# Patient Record
Sex: Male | Born: 1970 | Race: White | Hispanic: Yes | Marital: Married | Smoking: Never smoker
Health system: Southern US, Community
[De-identification: ages and names within clinical notes are randomized; demographics above are authoritative.]

## PROBLEM LIST (undated history)

## (undated) DIAGNOSIS — I10 Essential (primary) hypertension: Secondary | ICD-10-CM

## (undated) DIAGNOSIS — E785 Hyperlipidemia, unspecified: Secondary | ICD-10-CM

## (undated) DIAGNOSIS — K760 Fatty (change of) liver, not elsewhere classified: Secondary | ICD-10-CM

## (undated) HISTORY — PX: COLONOSCOPY: SHX174

## (undated) HISTORY — DX: Fatty (change of) liver, not elsewhere classified: K76.0

---

## 2010-07-02 ENCOUNTER — Other Ambulatory Visit: Payer: Self-pay | Admitting: *Deleted

## 2010-07-02 ENCOUNTER — Ambulatory Visit
Admission: RE | Admit: 2010-07-02 | Discharge: 2010-07-02 | Disposition: A | Discharge status: Home / Self Care | Payer: 59 | Source: Ambulatory Visit | Attending: *Deleted | Admitting: *Deleted

## 2010-07-02 DIAGNOSIS — T1490XA Injury, unspecified, initial encounter: Secondary | ICD-10-CM

## 2010-07-02 DIAGNOSIS — R609 Edema, unspecified: Secondary | ICD-10-CM

## 2011-07-01 ENCOUNTER — Emergency Department (HOSPITAL_COMMUNITY): Admission: EM | Admit: 2011-07-01 | Discharge: 2011-07-01 | Payer: 59 | Source: Home / Self Care

## 2012-03-20 IMAGING — CR DG TOE GREAT 2+V*L*
2 series · 2 of 2 positions shown · non-contrast
Comparison: None

CLINICAL DATA: Injured toe playing basketball.

LEFT TOE - 2+ VIEW

[view not recorded (1 of 2)]
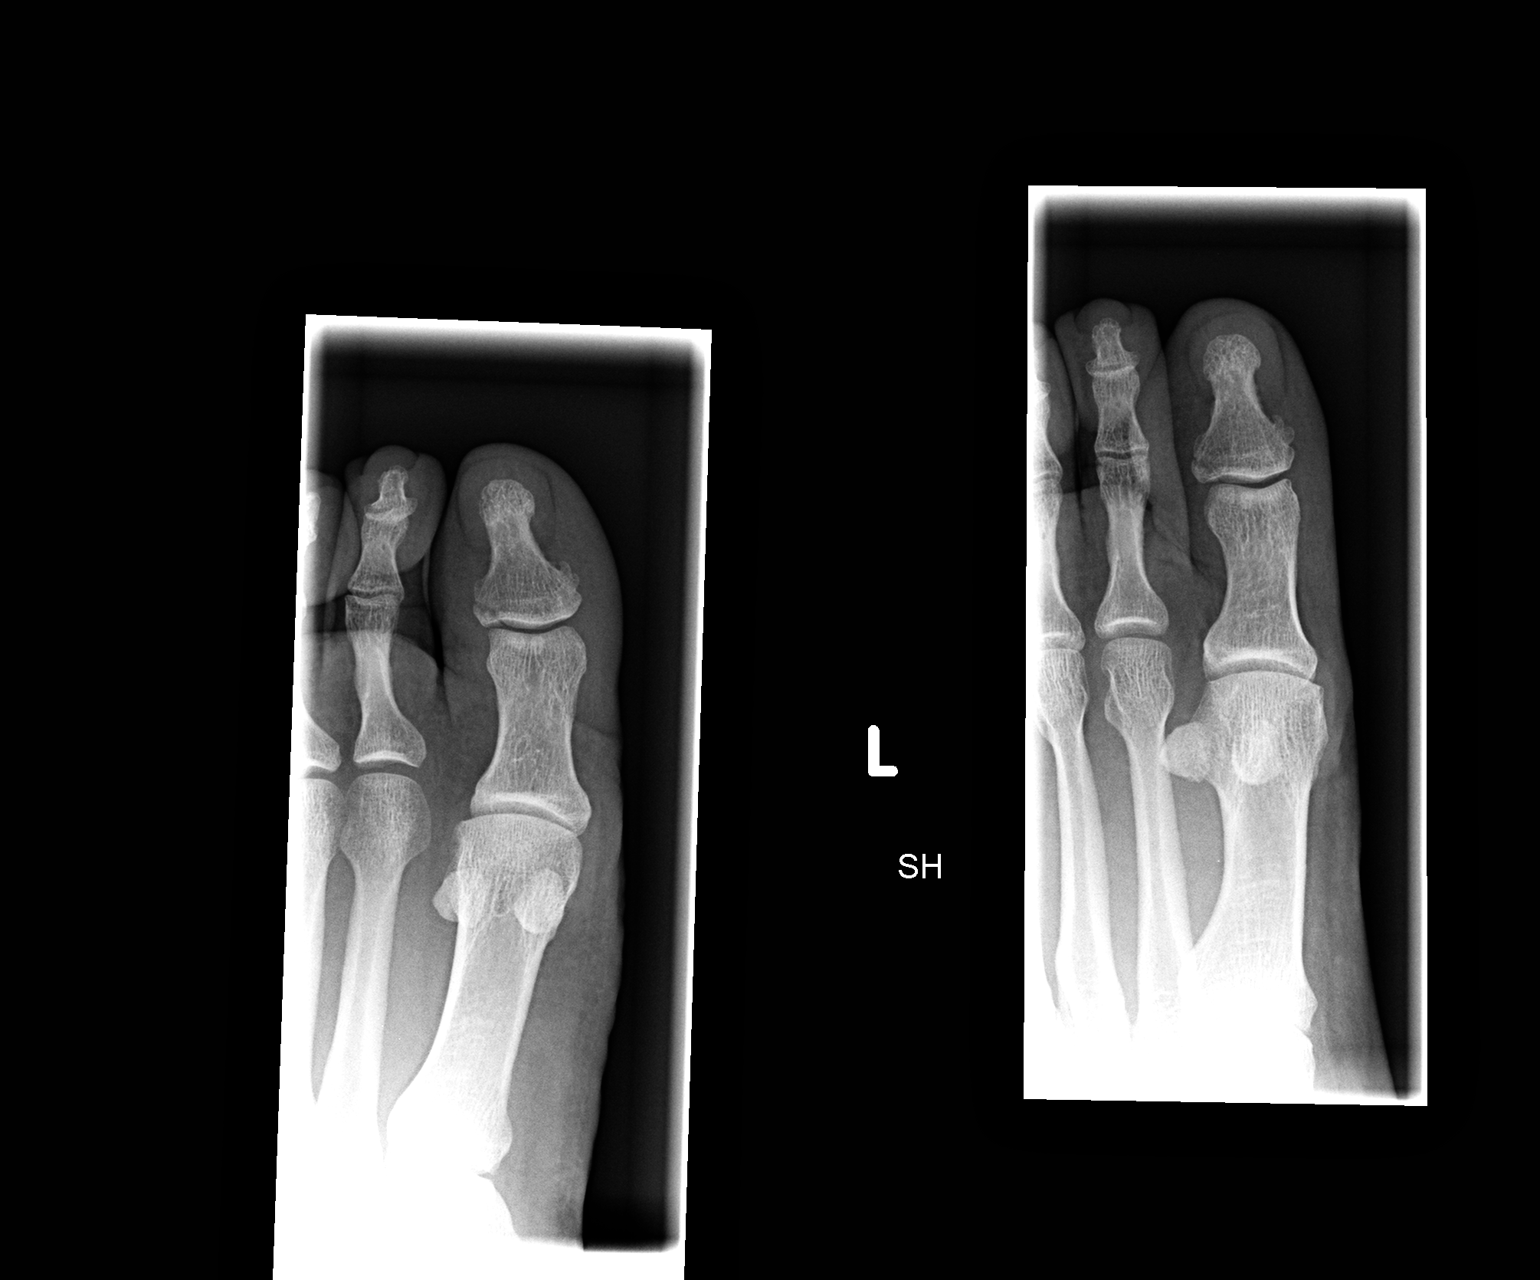

[view not recorded (2 of 2)]
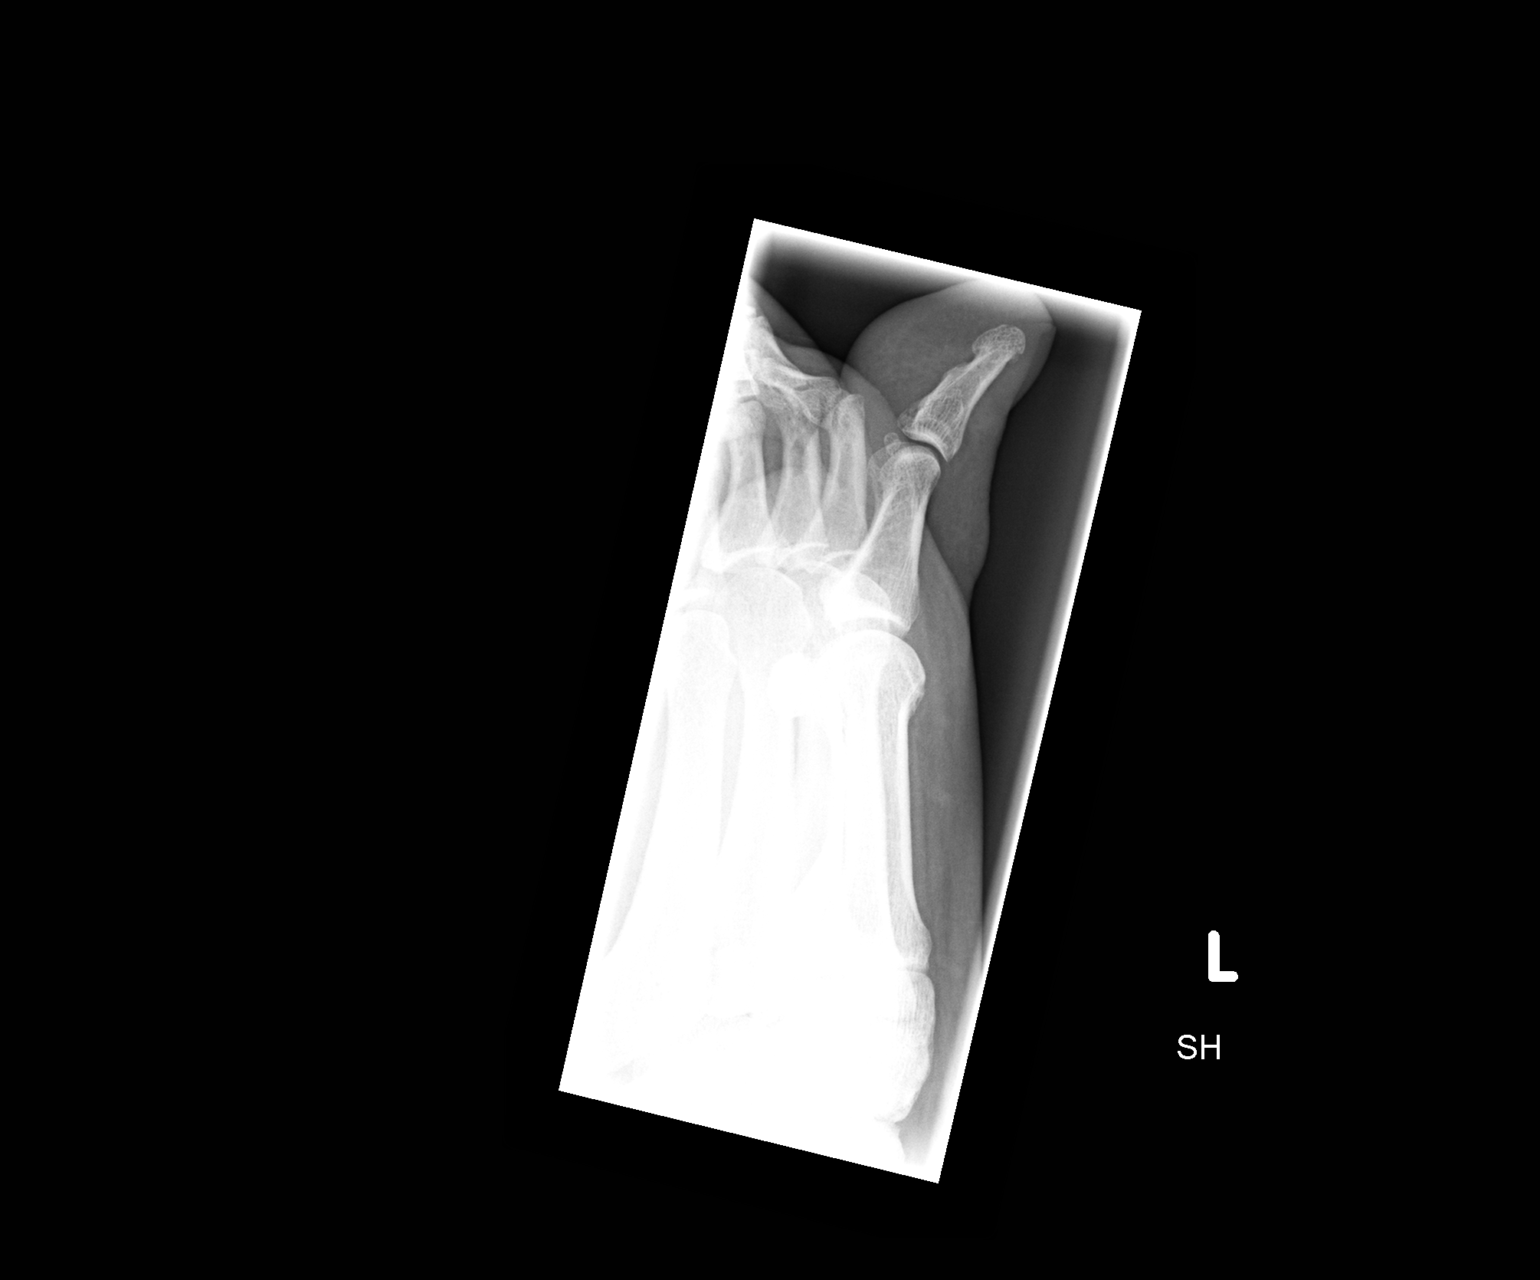

[2 of 2 positions shown; findings below may reference images not displayed]

FINDINGS: There is a intra-articular fracture involving the lateral
aspect of the distal phalanx.  No displacement.  The remaining
joint spaces are maintained.  No other fractures are seen.
IMPRESSION: Nondisplaced intra-articular fracture involving the lateral aspect
of the distal phalanx.

## 2012-07-04 ENCOUNTER — Other Ambulatory Visit: Payer: Self-pay

## 2012-07-04 ENCOUNTER — Ambulatory Visit
Admission: RE | Admit: 2012-07-04 | Discharge: 2012-07-04 | Disposition: A | Discharge status: Home / Self Care | Payer: Managed Care, Other (non HMO) | Source: Ambulatory Visit

## 2012-07-04 DIAGNOSIS — M79609 Pain in unspecified limb: Secondary | ICD-10-CM

## 2015-05-04 HISTORY — PX: ROTATOR CUFF REPAIR: SHX139

## 2015-08-18 DIAGNOSIS — I10 Essential (primary) hypertension: Secondary | ICD-10-CM | POA: Diagnosis not present

## 2015-08-18 DIAGNOSIS — Z0000 Encounter for general adult medical examination without abnormal findings: Secondary | ICD-10-CM | POA: Diagnosis not present

## 2015-08-18 DIAGNOSIS — E7800 Pure hypercholesterolemia, unspecified: Secondary | ICD-10-CM | POA: Diagnosis not present

## 2015-08-18 DIAGNOSIS — R739 Hyperglycemia, unspecified: Secondary | ICD-10-CM | POA: Diagnosis not present

## 2015-11-19 ENCOUNTER — Emergency Department (HOSPITAL_BASED_OUTPATIENT_CLINIC_OR_DEPARTMENT_OTHER)
Admission: EM | Admit: 2015-11-19 | Discharge: 2015-11-19 | Disposition: A | Discharge status: Home / Self Care | Payer: BLUE CROSS/BLUE SHIELD | Attending: Emergency Medicine | Admitting: Emergency Medicine

## 2015-11-19 ENCOUNTER — Encounter (HOSPITAL_BASED_OUTPATIENT_CLINIC_OR_DEPARTMENT_OTHER): Payer: Self-pay | Admitting: Emergency Medicine

## 2015-11-19 DIAGNOSIS — E785 Hyperlipidemia, unspecified: Secondary | ICD-10-CM | POA: Diagnosis not present

## 2015-11-19 DIAGNOSIS — M62830 Muscle spasm of back: Secondary | ICD-10-CM | POA: Diagnosis not present

## 2015-11-19 DIAGNOSIS — M545 Low back pain: Secondary | ICD-10-CM | POA: Diagnosis present

## 2015-11-19 DIAGNOSIS — Z79899 Other long term (current) drug therapy: Secondary | ICD-10-CM | POA: Insufficient documentation

## 2015-11-19 DIAGNOSIS — I10 Essential (primary) hypertension: Secondary | ICD-10-CM | POA: Insufficient documentation

## 2015-11-19 HISTORY — DX: Essential (primary) hypertension: I10

## 2015-11-19 HISTORY — DX: Hyperlipidemia, unspecified: E78.5

## 2015-11-19 MED ORDER — HYDROCODONE-ACETAMINOPHEN 5-325 MG PO TABS: 1.0000 | ORAL_TABLET | Freq: Four times a day (QID) | ORAL | Status: DC | PRN

## 2015-11-19 MED ORDER — IBUPROFEN 800 MG PO TABS: 800.0000 mg | ORAL_TABLET | Freq: Three times a day (TID) | ORAL | Status: DC | PRN

## 2015-11-19 MED ORDER — CYCLOBENZAPRINE HCL 10 MG PO TABS
10.0000 mg | ORAL_TABLET | Freq: Once | ORAL | Status: AC
  Administered 2015-11-19: 10 mg via ORAL
  Filled 2015-11-19: qty 1

## 2015-11-19 MED ORDER — CYCLOBENZAPRINE HCL 10 MG PO TABS: 5.0000 mg | ORAL_TABLET | Freq: Three times a day (TID) | ORAL | Status: DC | PRN

## 2015-11-19 MED ORDER — IBUPROFEN 800 MG PO TABS
800.0000 mg | ORAL_TABLET | Freq: Once | ORAL | Status: AC
  Administered 2015-11-19: 800 mg via ORAL
  Filled 2015-11-19: qty 1

## 2015-11-19 NOTE — ED Notes (Addendum)
Patient reports lower back pain which began 11/09/15 after playing basketball.  Reports this subsided but then he played basketball again this past Sunday and reports back spasms which began yesterday. Denies loss of bowel or bladder control.

## 2015-11-19 NOTE — ED Provider Notes (Signed)
By signing my name below, I, Phillis HaggisGabriella Gaje, attest that this documentation has been prepared under the direction and in the presence of Kristen N Ward, DO. Electronically Signed: Phillis HaggisGabriella Gaje, ED Scribe. 11/19/2015. 11:32 PM.  TIME SEEN: 11:21 PM  CHIEF COMPLAINT: Chief Complaint  Patient presents with  . Back Pain   HPI: HPI Comments: Kevin [name] is a 45 y.o. male with a hx of HTN  who presents to the Emergency Department complaining of gradually worsening, dull lower back pain onset 10 days ago. He states that he was playing basketball on 11/09/15 and began to experience back pain. The pain has significantly worsened since 11/16/15 after playing basketball again with associated spasms that began yesterday and difficulty walking due to pain. Pt reports relief with laying on his right side and worsening pain with standing. He has tried muscle relaxants and anti-inflammatories for pain to no relief. He has also taken a dose of Tramadol from his mother to no relief. He has previously been on Flexeril for back pain to relief, but did not experience spasms with his previous back pain. He denies hx of cancer, DM, IVDU, fever, Urinary retention, bladder or bowel incontinence, numbness, or weakness. Denies history of epidural injections, back surgery. States he tried naproxen, tramadol, Robaxin at home without any relief. Tramadol was his mother's prescription, Robaxin with his significant others.  ROS: See HPI Constitutional: no fever  Eyes: no drainage  ENT: no runny nose   Cardiovascular:  no chest pain  Resp: no SOB  GI: no vomiting GU: no dysuria Integumentary: no rash  Allergy: no hives  Musculoskeletal: no leg swelling  Neurological: no slurred speech ROS otherwise negative  PAST MEDICAL HISTORY/PAST SURGICAL HISTORY:  Past Medical History  Diagnosis Date  . Hypertension   . Hyperlipidemia     MEDICATIONS:  Prior to Admission medications   Medication Sig Start Date End Date  Taking? Authorizing Provider  ATORVASTATIN CALCIUM PO Take by mouth.   Yes Historical Provider, MD  lisinopril (PRINIVIL,ZESTRIL) 10 MG tablet Take 10 mg by mouth daily.   Yes Historical Provider, MD    ALLERGIES:  No Known Allergies  SOCIAL HISTORY:  Social History  Substance Use Topics  . Smoking status: Never Smoker   . Smokeless tobacco: Not on file  . Alcohol Use: No    FAMILY HISTORY: History reviewed. No pertinent family history.  EXAM: BP 152/72 mmHg  Pulse 85  Temp(Src) 98.8 F (37.1 C) (Oral)  Resp 18  Ht 6\' 2"  (1.88 m)  Wt 325 lb (147.419 kg)  BMI 41.71 kg/m2  SpO2 95% CONSTITUTIONAL: Alert and oriented and responds appropriately to questions. Well-appearing; well-nourished HEAD: Normocephalic EYES: Conjunctivae clear, PERRL ENT: normal nose; no rhinorrhea; moist mucous membranes NECK: Supple, no meningismus, no LAD  CARD: RRR; S1 and S2 appreciated; no murmurs, no clicks, no rubs, no gallops RESP: Normal chest excursion without splinting or tachypnea; breath sounds clear and equal bilaterally; no wheezes, no rhonchi, no rales, no hypoxia or respiratory distress, speaking full sentences ABD/GI: Normal bowel sounds; non-distended; soft, non-tender, no rebound, no guarding, no peritoneal signs BACK:  The back appears normal and is tender to palpation to the entire lower lumbar paraspinal musculature without midline tenderness, step-off or deformity, there is no CVA tenderness EXT: Normal ROM in all joints; non-tender to palpation; no edema; normal capillary refill; no cyanosis, no calf tenderness or swelling    SKIN: Normal color for age and race; warm; no rash NEURO: Moves  all extremities equally, sensation to light touch intact diffusely, cranial nerves II through XII intact; no saddle anesthesia; 2+ bilateral lower extremity deep tendon reflexes PSYCH: The patient's mood and manner are appropriate. Grooming and personal hygiene are appropriate.  MEDICAL DECISION  MAKING: Patient here with back pain. No midline spinal tenderness. No history of known injury other than playing basketball. States he woke up the next morning and felt his back was tight. I do not think that he has cauda equina, spinal stenosis, epidural abscess or hematoma, discitis or transverse myelitis. Do not feel he needs emergent imaging of his back. Doubt fracture. I recommended he continue anti-inflammatories daily and I'll discharge him with Flexeril as this has helped him in the past. Will also discharge with prescription for Vicodin. Have provided him with a work note. Have recommended rest, stretching, heat. Patient's significant other here to drive him home. Given ibuprofen and Flexeril in the emergency department.   At this time, I do not feel there is any life-threatening condition present. I have reviewed and discussed all results (EKG, imaging, lab, urine as appropriate), exam findings with patient. I have reviewed nursing notes and appropriate previous records.  I feel the patient is safe to be discharged home without further emergent workup. Discussed usual and customary return precautions. Patient and family (if present) verbalize understanding and are comfortable with this plan.  Patient will follow-up with their primary care provider. If they do not have a primary care provider, information for follow-up has been provided to them. All questions have been answered.   I personally performed the services described in this documentation, which was scribed in my presence. The recorded information has been reviewed and is accurate.      Layla Maw Ward, DO 11/19/15 2356

## 2015-11-19 NOTE — Discharge Instructions (Signed)
Heat Therapy Heat therapy can help ease sore, stiff, injured, and tight muscles and joints. Heat relaxes your muscles, which may help ease your pain.  RISKS AND COMPLICATIONS If you have any of the following conditions, do not use heat therapy unless your health care provider has approved:  Poor circulation.  Healing wounds or scarred skin in the area being treated.  Diabetes, heart disease, or high blood pressure.  Not being able to feel (numbness) the area being treated.  Unusual swelling of the area being treated.  Active infections.  Blood clots.  Cancer.  Inability to communicate pain. This may include young children and people who have problems with their brain function (dementia).  Pregnancy. Heat therapy should only be used on old, pre-existing, or long-lasting (chronic) injuries. Do not use heat therapy on new injuries unless directed by your health care provider. HOW TO USE HEAT THERAPY There are several different kinds of heat therapy, including:  Moist heat pack.  Warm water bath.  Hot water bottle.  Electric heating pad.  Heated gel pack.  Heated wrap.  Electric heating pad. Use the heat therapy method suggested by your health care provider. Follow your health care provider's instructions on when and how to use heat therapy. GENERAL HEAT THERAPY RECOMMENDATIONS  Do not sleep while using heat therapy. Only use heat therapy while you are awake.  Your skin may turn pink while using heat therapy. Do not use heat therapy if your skin turns red.  Do not use heat therapy if you have new pain.  High heat or long exposure to heat can cause burns. Be careful when using heat therapy to avoid burning your skin.  Do not use heat therapy on areas of your skin that are already irritated, such as with a rash or sunburn. SEEK MEDICAL CARE IF:  You have blisters, redness, swelling, or numbness.  You have new pain.  Your pain is worse. MAKE SURE  YOU:  Understand these instructions.  Will watch your condition.  Will get help right away if you are not doing well or get worse.   This information is not intended to replace advice given to you by your health care provider. Make sure you discuss any questions you have with your health care provider.   Document Released: 07/12/2011 Document Revised: 05/10/2014 Document Reviewed: 06/12/2013 Elsevier Interactive Patient Education 2016 Elsevier Inc.  Back Exercises The following exercises strengthen the muscles that help to support the back. They also help to keep the lower back flexible. Doing these exercises can help to prevent back pain or lessen existing pain. If you have back pain or discomfort, try doing these exercises 2-3 times each day or as told by your health care provider. When the pain goes away, do them once each day, but increase the number of times that you repeat the steps for each exercise (do more repetitions). If you do not have back pain or discomfort, do these exercises once each day or as told by your health care provider. EXERCISES Single Knee to Chest Repeat these steps 3-5 times for each leg:  Lie on your back on a firm bed or the floor with your legs extended.  Bring one knee to your chest. Your other leg should stay extended and in contact with the floor.  Hold your knee in place by grabbing your knee or thigh.  Pull on your knee until you feel a gentle stretch in your lower back.  Hold the stretch for 10-30 seconds.  Slowly release and straighten your leg. Pelvic Tilt Repeat these steps 5-10 times:  Lie on your back on a firm bed or the floor with your legs extended.  Bend your knees so they are pointing toward the ceiling and your feet are flat on the floor.  Tighten your lower abdominal muscles to press your lower back against the floor. This motion will tilt your pelvis so your tailbone points up toward the ceiling instead of pointing to your feet  or the floor.  With gentle tension and even breathing, hold this position for 5-10 seconds. Cat-Cow Repeat these steps until your lower back becomes more flexible:  Get into a hands-and-knees position on a firm surface. Keep your hands under your shoulders, and keep your knees under your hips. You may place padding under your knees for comfort.  Let your head hang down, and point your tailbone toward the floor so your lower back becomes rounded like the back of a cat.  Hold this position for 5 seconds.  Slowly lift your head and point your tailbone up toward the ceiling so your back forms a sagging arch like the back of a cow.  Hold this position for 5 seconds. Press-Ups Repeat these steps 5-10 times:  Lie on your abdomen (face-down) on the floor.  Place your palms near your head, about shoulder-width apart.  While you keep your back as relaxed as possible and keep your hips on the floor, slowly straighten your arms to raise the top half of your body and lift your shoulders. Do not use your back muscles to raise your upper torso. You may adjust the placement of your hands to make yourself more comfortable.  Hold this position for 5 seconds while you keep your back relaxed.  Slowly return to lying flat on the floor. Bridges Repeat these steps 10 times:  Lie on your back on a firm surface.  Bend your knees so they are pointing toward the ceiling and your feet are flat on the floor.  Tighten your buttocks muscles and lift your buttocks off of the floor until your waist is at almost the same height as your knees. You should feel the muscles working in your buttocks and the back of your thighs. If you do not feel these muscles, slide your feet 1-2 inches farther away from your buttocks.  Hold this position for 3-5 seconds.  Slowly lower your hips to the starting position, and allow your buttocks muscles to relax completely. If this exercise is too easy, try doing it with your arms  crossed over your chest. Abdominal Crunches Repeat these steps 5-10 times: 1. Lie on your back on a firm bed or the floor with your legs extended. 2. Bend your knees so they are pointing toward the ceiling and your feet are flat on the floor. 3. Cross your arms over your chest. 4. Tip your chin slightly toward your chest without bending your neck. 5. Tighten your abdominal muscles and slowly raise your trunk (torso) high enough to lift your shoulder blades a tiny bit off of the floor. Avoid raising your torso higher than that, because it can put too much stress on your low back and it does not help to strengthen your abdominal muscles. 6. Slowly return to your starting position. Back Lifts Repeat these steps 5-10 times: 1. Lie on your abdomen (face-down) with your arms at your sides, and rest your forehead on the floor. 2. Tighten the muscles in your legs and your buttocks.  3. Slowly lift your chest off of the floor while you keep your hips pressed to the floor. Keep the back of your head in line with the curve in your back. Your eyes should be looking at the floor. 4. Hold this position for 3-5 seconds. 5. Slowly return to your starting position. SEEK MEDICAL CARE IF:  Your back pain or discomfort gets much worse when you do an exercise.  Your back pain or discomfort does not lessen within 2 hours after you exercise. If you have any of these problems, stop doing these exercises right away. Do not do them again unless your health care provider says that you can. SEEK IMMEDIATE MEDICAL CARE IF:  You develop sudden, severe back pain. If this happens, stop doing the exercises right away. Do not do them again unless your health care provider says that you can.   This information is not intended to replace advice given to you by your health care provider. Make sure you discuss any questions you have with your health care provider.   Document Released: 05/27/2004 Document Revised: 01/08/2015  Document Reviewed: 06/13/2014 Elsevier Interactive Patient Education 2016 Elsevier Inc. Lumbosacral Strain Lumbosacral strain is a strain of any of the parts that make up your lumbosacral vertebrae. Your lumbosacral vertebrae are the bones that make up the lower third of your backbone. Your lumbosacral vertebrae are held together by muscles and tough, fibrous tissue (ligaments).  CAUSES  A sudden blow to your back can cause lumbosacral strain. Also, anything that causes an excessive stretch of the muscles in the low back can cause this strain. This is typically seen when people exert themselves strenuously, fall, lift heavy objects, bend, or crouch repeatedly. RISK FACTORS  Physically demanding work.  Participation in pushing or pulling sports or sports that require a sudden twist of the back (tennis, golf, baseball).  Weight lifting.  Excessive lower back curvature.  Forward-tilted pelvis.  Weak back or abdominal muscles or both.  Tight hamstrings. SIGNS AND SYMPTOMS  Lumbosacral strain may cause pain in the area of your injury or pain that moves (radiates) down your leg.  DIAGNOSIS Your health care provider can often diagnose lumbosacral strain through a physical exam. In some cases, you may need tests such as X-ray exams.  TREATMENT  Treatment for your lower back injury depends on many factors that your clinician will have to evaluate. However, most treatment will include the use of anti-inflammatory medicines. HOME CARE INSTRUCTIONS   Avoid hard physical activities (tennis, racquetball, waterskiing) if you are not in proper physical condition for it. This may aggravate or create problems.  If you have a back problem, avoid sports requiring sudden body movements. Swimming and walking are generally safer activities.  Maintain good posture.  Maintain a healthy weight.  For acute conditions, you may put ice on the injured area.  Put ice in a plastic bag.  Place a towel  between your skin and the bag.  Leave the ice on for 20 minutes, 2-3 times a day.  When the low back starts healing, stretching and strengthening exercises may be recommended. SEEK MEDICAL CARE IF:  Your back pain is getting worse.  You experience severe back pain not relieved with medicines. SEEK IMMEDIATE MEDICAL CARE IF:   You have numbness, tingling, weakness, or problems with the use of your arms or legs.  There is a change in bowel or bladder control.  You have increasing pain in any area of the body, including your belly (  abdomen).  You notice shortness of breath, dizziness, or feel faint.  You feel sick to your stomach (nauseous), are throwing up (vomiting), or become sweaty.  You notice discoloration of your toes or legs, or your feet get very cold. MAKE SURE YOU:   Understand these instructions.  Will watch your condition.  Will get help right away if you are not doing well or get worse.   This information is not intended to replace advice given to you by your health care provider. Make sure you discuss any questions you have with your health care provider.   Document Released: 01/27/2005 Document Revised: 05/10/2014 Document Reviewed: 12/06/2012 Elsevier Interactive Patient Education [employer] Elsevier Inc.   To find a primary care or specialty doctor please call [phone] or [phone] to access "[facility] Find a Doctor Service."  You may also go on the [facility] website at [email or web address]  There are also multiple [facility], [facility] and [facility] practices throughout the [place] that are frequently accepting new patients. You may find a clinic that is close to your home and contact them.  [facility] -  [address] [place] [phone]  [facility] in [place] (also locations in [place] and [place]) -  [address] [place]  [id number] [phone]  [facility] -  [address] [place] [id number] [phone]

## 2016-01-13 DIAGNOSIS — Z01419 Encounter for gynecological examination (general) (routine) without abnormal findings: Secondary | ICD-10-CM | POA: Diagnosis not present

## 2016-08-26 DIAGNOSIS — R7301 Impaired fasting glucose: Secondary | ICD-10-CM | POA: Diagnosis not present

## 2016-08-26 DIAGNOSIS — Z0000 Encounter for general adult medical examination without abnormal findings: Secondary | ICD-10-CM | POA: Diagnosis not present

## 2016-08-26 DIAGNOSIS — E7800 Pure hypercholesterolemia, unspecified: Secondary | ICD-10-CM | POA: Diagnosis not present

## 2016-08-26 DIAGNOSIS — I10 Essential (primary) hypertension: Secondary | ICD-10-CM | POA: Diagnosis not present

## 2016-09-16 ENCOUNTER — Ambulatory Visit: Payer: BLUE CROSS/BLUE SHIELD | Attending: Family Medicine

## 2016-09-16 DIAGNOSIS — R293 Abnormal posture: Secondary | ICD-10-CM | POA: Diagnosis not present

## 2016-09-16 DIAGNOSIS — M25612 Stiffness of left shoulder, not elsewhere classified: Secondary | ICD-10-CM

## 2016-09-16 DIAGNOSIS — M25512 Pain in left shoulder: Secondary | ICD-10-CM | POA: Insufficient documentation
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
## 2016-09-16 NOTE — Patient Instructions (Addendum)
Hold all stretches 5-10 seconds and perform 5-10 times, 3 times a day.     Sitting upright, slide forearm forward along table, bending from the waist until a stretch is felt. Copyright  VHI. All rights reserved.    Clasp hands together and raise arms above head, keeping elbows as straight as possible. Can be done sitting or lying.   Copyright  VHI. All rights reserved.  KEEP HEAD IN NEUTRAL AND SHOULDERS DOWN AND RELAXED   Copyright  VHI. All rights reserved.     Pull shoulder blades back and hold 5 seconds.  Do 5-10 reps many times a day.     External Rotation    Pull with both arms at the same time.  2x10, 2-3 times a day.      Posture - Standing   Good posture is important. Avoid slouching and forward head thrust. Maintain curve in low back and align ears over shoulders, hips over ankles.  Pull your belly button in toward your back bone. Posture Tips DO: - stand tall and erect - keep chin tucked in - keep head and shoulders in alignment - check posture regularly in mirror or large window - pull head back against headrest in car seat;  Change your position often.  Sit with lumbar support. DON'T: - slouch or slump while watching TV or reading - sit, stand or lie in one position  for too long;  Sitting is especially hard on the spine so if you sit at a desk/use the computer, then stand up often! Copyright  VHI. All rights reserved.  Posture - Sitting  Sit upright, head facing forward. Try using a roll to support lower back. Keep shoulders relaxed, and avoid rounded back. Keep hips level with knees. Avoid crossing legs for long periods. Copyright  VHI. All rights reserved.  Chronic neck strain can develop because of poor posture and faulty work habits  Postural strain related to slumped sitting and forward head posture is a leading cause of headaches, neck and upper back pain  General strengthening and flexibility exercises are helpful in the treatment of neck pain.   Most importantly, you should learn to correct the posture that may be contributing to chronic pain.   Change positions frequently  Change your work or home environment to improve posture and mechanics.   [facility] [address], [address] [place], [place] [address] Phone # [phone] Fax [phone]

## 2016-09-22 ENCOUNTER — Encounter: Payer: Self-pay | Admitting: Physical Therapy

## 2016-09-22 ENCOUNTER — Ambulatory Visit: Payer: BLUE CROSS/BLUE SHIELD | Admitting: Physical Therapy

## 2016-09-22 DIAGNOSIS — R293 Abnormal posture: Secondary | ICD-10-CM | POA: Diagnosis not present

## 2016-09-22 DIAGNOSIS — M25612 Stiffness of left shoulder, not elsewhere classified: Secondary | ICD-10-CM

## 2016-09-22 DIAGNOSIS — M25512 Pain in left shoulder: Secondary | ICD-10-CM | POA: Diagnosis not present

## 2016-09-22 NOTE — Therapy (Signed)
[facility] [address] [place], [place], [address] Phone: [phone]   Fax:  [phone]  Physical Therapy Treatment  Patient Details  Name: Kevin [name] MRN: [id number] Date of Birth: August 24, 1970 Referring Provider: Darrow Bussing, MD  Encounter Date: 09/22/2016      PT End of Session - 09/22/16 0807    Visit Number 2   Date for PT Re-Evaluation 11/11/16   PT Start Time 0802   PT Stop Time 0847   PT Time Calculation (min) 45 min   Activity Tolerance Patient tolerated treatment well   Behavior During Therapy [facility] for tasks assessed/performed      Past Medical History:  Diagnosis Date  . Hyperlipidemia   . Hypertension     History reviewed. No pertinent surgical history.  There were no vitals filed for this visit.      Subjective Assessment - 09/22/16 0806    Subjective A little pain this AM but not bad.    Pain Score 2    Pain Location Shoulder   Pain Orientation Left   Pain Descriptors / Indicators Aching;Dull   Aggravating Factors  Sleeping    Pain Relieving Factors keeping arm low   Multiple Pain Sites No                         [facility] Adult PT Treatment/Exercise - 09/22/16 0001      Self-Care   Self-Care Heat/Ice Application   Heat/Ice Application Pt was instructed in physiologic effects of cold and will ice post work     Shoulder Exercises: Seated   Horizontal ABduction Strengthening;Both;15 reps;Theraband   Theraband Level (Shoulder Horizontal ABduction) Level 3 (Green)   External Rotation Strengthening;Both;15 reps;Theraband   Theraband Level (Shoulder External Rotation) Level 3 (Green)     Shoulder Exercises: Pulleys   Flexion 3 minutes   ABduction 3 minutes     Shoulder Exercises: ROM/Strengthening   UBE (Upper Arm Bike) L3 3x3 with postural focus and initial verbal cues only      Shoulder Exercises: Stretch   Other Shoulder Stretches Supine soft roll  shoulder  flexion overhead with cane 10x     Ultrasound   Ultrasound Location LT anteriolateral shoulder   Ultrasound Parameters 1 MZ 100% 1.2wtcm2 8 min   Ultrasound Goals Pain                  PT Short Term Goals - 09/22/16 0850      PT SHORT TERM GOAL #1   Title be independent in initial HEP   Time 4   Period Weeks   Status Achieved     PT SHORT TERM GOAL #3   Title report waking < or = to 1x at night due to pain   Time 4   Period Weeks   Status On-going  A few times     PT SHORT TERM GOAL #4   Title verbalize and demonstrate postural corrections with work and home tasks   Time 4   Period Weeks   Status Achieved           PT Long Term Goals - 09/16/16 0759      PT LONG TERM GOAL #1   Title be independent in advanced HEP   Time 8   Period Weeks   Status New     PT LONG TERM GOAL #2   Title reduce FOTO to < or = to 27%  limitation   Time 8   Period Weeks   Status New     PT LONG TERM GOAL #3   Title demonstrate full Lt shoulder P/ROM to improve use of Lt UE   Time 8   Period Weeks   Status New     PT LONG TERM GOAL #4   Title report no sleep limitations due to Lt shoulder pain   Time 8   Period Weeks   Status New     PT LONG TERM GOAL #5   Title report < or = to 1/10 Lt shoulder pain with overhead use   Time 8   Period Weeks   Status New               Plan - 09/22/16 16100807    Clinical Impression Statement Pt presented with low level pain this AM and tenderness to palpation to the anterior soft tissues. Ionto order is not signed as of today so we proceeded with a treatment of ultrasound for pain and healing promotion. Pt is independent and compliant with his HEP.  Pt wias instructed to reverse his posture often throughout the day as he sits at a desk all day and to apply ice on his shoulder post work for 10 min.    Rehab Potential Good   PT Frequency 2x / week   PT Duration 8 weeks   PT Treatment/Interventions ADLs/Self Care  Home Management;Electrical Stimulation;Cryotherapy;Iontophoresis 4mg /ml Dexamethasone;Functional mobility training;Ultrasound;Moist Heat;Therapeutic activities;Therapeutic exercise;Neuromuscular re-education;Passive range of motion;Dry needling;Taping   PT Next Visit Plan Check ionto order, US if pt found helpful, postural strength: continue supine on foam roll to stetch and open the front body.    Consulted and Agree with Plan of Care Patient      Patient will benefit from skilled therapeutic intervention in order to improve the following deficits and impairments:  Postural dysfunction, Decreased strength, Impaired flexibility, Pain, Decreased activity tolerance, Decreased range of motion, Impaired vision/preception  Visit Diagnosis: Acute pain of left shoulder  Abnormal posture  Stiffness of left shoulder, not elsewhere classified     Problem List There are no active problems to display for this patient.   COCHRAN,JENNIFER, PTA 09/22/2016, 8:53 AM  [facility] [address] [place], [place], [address] Phone: [phone]   Fax:  [phone]  Name: Kevin [name] MRN: [id number] Date of Birth: Nov 08, 1970

## 2016-09-29 ENCOUNTER — Ambulatory Visit: Payer: BLUE CROSS/BLUE SHIELD | Admitting: Physical Therapy

## 2016-09-29 ENCOUNTER — Encounter: Payer: Self-pay | Admitting: Physical Therapy

## 2016-09-29 DIAGNOSIS — R293 Abnormal posture: Secondary | ICD-10-CM | POA: Diagnosis not present

## 2016-09-29 DIAGNOSIS — M25612 Stiffness of left shoulder, not elsewhere classified: Secondary | ICD-10-CM | POA: Diagnosis not present

## 2016-09-29 DIAGNOSIS — M25512 Pain in left shoulder: Secondary | ICD-10-CM

## 2016-09-29 NOTE — Therapy (Signed)
[facility] [address] [place], [place], [address] Phone: [phone]   Fax:  [phone]  Physical Therapy Treatment  Patient Details  Name: Benay SpiceMichael E Chui MRN: [id number] Date of Birth: [birth year] Referring Provider: Darrow BussingKoirala, Dibas, MD  Encounter Date: 09/29/2016      PT End of Session - 09/29/16 0806    Visit Number 3   Date for PT Re-Evaluation 11/11/16   PT Start Time 0801   PT Stop Time 0842   PT Time Calculation (min) 41 min   Activity Tolerance Patient tolerated treatment well   Behavior [facility] Therapy [facility] for tasks assessed/performed      Past Medical History:  Diagnosis Date  . Hyperlipidemia   . Hypertension     History reviewed. No pertinent surgical history.  There were no vitals filed for this visit.      Subjective Assessment - 09/29/16 0804    Subjective It's not really bad.  At night if I roll over or sleep on it, it wakes me up.  Quick movements hurt.   Patient Stated Goals reduce Lt shoulder, use Lt UE overhead, play basketball   Currently in Pain? Yes   Pain Score 4    Pain Location Shoulder   Pain Orientation Left   Pain Descriptors / Indicators Aching   Pain Type Chronic pain   Pain Onset More than a month ago   Pain Frequency Intermittent   Aggravating Factors  sleeping   Pain Relieving Factors keeping arm low   Multiple Pain Sites No                         [facility] Adult PT Treatment/Exercise - 09/29/16 0001      Shoulder Exercises: Seated   Horizontal ABduction Strengthening;Both;15 reps;Theraband   Theraband Level (Shoulder Horizontal ABduction) Level 3 (Green)   External Rotation Strengthening;Both;15 reps;Theraband   Theraband Level (Shoulder External Rotation) Level 3 (Green)     Shoulder Exercises: Standing   External Rotation Strengthening;Left;20 reps;Theraband   Theraband Level (Shoulder External Rotation) Level 2 (Red)   Internal Rotation  Strengthening;Left;20 reps;Theraband   Theraband Level (Shoulder Internal Rotation) Level 2 (Red)   Flexion AAROM;Left;20 reps  wall slide towel   Extension Strengthening;Both;20 reps;Theraband   Theraband Level (Shoulder Extension) Level 3 (Green)   Row Strengthening;Both;20 reps;Theraband   Theraband Level (Shoulder Row) Level 3 (Green)     Shoulder Exercises: Pulleys   Flexion 3 minutes   ABduction 3 minutes     Shoulder Exercises: ROM/Strengthening   UBE (Upper Arm Bike) L3 3x3 with postural focus and initial verbal cues only      Ultrasound   Ultrasound Location Lt anteriolateral shoulder   Ultrasound Parameters 1 MHz 100% duty 1.2 W/cm   Ultrasound Goals Pain                  PT Short Term Goals - 09/29/16 [id number]      PT SHORT TERM GOAL #2   Title report < or = to 3/10 Lt shoulder pain with reaching overhead   Time 4   Period Weeks   Status On-going     PT SHORT TERM GOAL #3   Title report waking < or = to 1x at night due to pain   Baseline sometimes more than once   Time 4   Period Weeks   Status On-going           PT Long  Term Goals - 09/16/16 0759      PT LONG TERM GOAL #1   Title be independent in advanced HEP   Time 8   Period Weeks   Status New     PT LONG TERM GOAL #2   Title reduce FOTO to < or = to 27% limitation   Time 8   Period Weeks   Status New     PT LONG TERM GOAL #3   Title demonstrate full Lt shoulder P/ROM to improve use of Lt UE   Time 8   Period Weeks   Status New     PT LONG TERM GOAL #4   Title report no sleep limitations due to Lt shoulder pain   Time 8   Period Weeks   Status New     PT LONG TERM GOAL #5   Title report < or = to 1/10 Lt shoulder pain with overhead use   Time 8   Period Weeks   Status New               Plan - 09/29/16 0807    Clinical Impression Statement Ionto order not signed yet.  Pt responded well to [place] at last visit so that was performed today.  Pt able to increase RTC and  scapular strengthening exercises with education on scap squeezes during the day to prevent tightness in the chest.  Pt needs skilled therapy for postural strength.   PT Treatment/Interventions ADLs/Self Care Home Management;Electrical Stimulation;Cryotherapy;Iontophoresis 4mg /ml Dexamethasone;Functional mobility training;Ultrasound;Moist Heat;Therapeutic activities;Therapeutic exercise;Neuromuscular re-education;Passive range of motion;Dry needling;Taping   PT Next Visit Plan Check ionto order, postural strength: continue supine on foam roll to stetch and open the front body.    Consulted and Agree with Plan of Care Patient      Patient will benefit from skilled therapeutic intervention in order to improve the following deficits and impairments:  Postural dysfunction, Decreased strength, Impaired flexibility, Pain, Decreased activity tolerance, Decreased range of motion, Impaired vision/preception  Visit Diagnosis: Acute pain of left shoulder  Abnormal posture  Stiffness of left shoulder, not elsewhere classified     Problem List There are no active problems to display for this patient.   Vincente Poli, PT 09/29/2016, 8:45 AM  [facility] [address], [address] [place], [place], [address] Phone: [phone]   Fax:  [phone]  Name: TUFF CLABO MRN: [id number] Date of Birth: 04/29/1971

## 2016-10-06 ENCOUNTER — Ambulatory Visit: Payer: BLUE CROSS/BLUE SHIELD | Attending: Family Medicine | Admitting: Physical Therapy

## 2016-10-06 ENCOUNTER — Encounter: Payer: Self-pay | Admitting: Physical Therapy

## 2016-10-06 DIAGNOSIS — M25512 Pain in left shoulder: Secondary | ICD-10-CM | POA: Diagnosis not present

## 2016-10-06 DIAGNOSIS — M25612 Stiffness of left shoulder, not elsewhere classified: Secondary | ICD-10-CM | POA: Insufficient documentation

## 2016-10-06 DIAGNOSIS — R293 Abnormal posture: Secondary | ICD-10-CM | POA: Diagnosis not present

## 2016-10-06 NOTE — Therapy (Signed)
[facility] [address], [address] [place], [place], [address] Phone: [phone]   Fax:  [phone]  Physical Therapy Treatment  Patient Details  Name: Kevin SpiceMichael E Lebow MRN: [id number] Date of Birth: 1970/07/31 Referring Provider: Darrow BussingKoirala, Dibas, MD  Encounter Date: 10/06/2016      PT End of Session - 10/06/16 0802    Visit Number 4   Date for PT Re-Evaluation 11/11/16   PT Start Time 0802   PT Stop Time 0842   PT Time Calculation (min) 40 min   Activity Tolerance Patient tolerated treatment well   Behavior During Therapy [facility] for tasks assessed/performed      Past Medical History:  Diagnosis Date  . Hyperlipidemia   . Hypertension     History reviewed. No pertinent surgical history.  There were no vitals filed for this visit.      Subjective Assessment - 10/06/16 0805    Subjective monday I mowed the grass and had pain in my upper back because I was trying not to use the left afrm as much.  No pain this mrning just stiff which it usually is in the morning.   Patient Stated Goals reduce Lt shoulder, use Lt UE overhead, play basketball   Currently in Pain? No/denies                         [facility] Adult PT Treatment/Exercise - 10/06/16 0001      Neck Exercises: Supine   Neck Retraction 10 reps;5 secs  on foam roll   Shoulder Flexion Both;15 reps  cane AAROM   Shoulder ABduction Both;15 reps  cane AAROM     Shoulder Exercises: Supine   Protraction AAROM;Left;20 reps;Weights   Protraction Weight (lbs) 5   Horizontal ABduction Strengthening;Both;20 reps;Theraband  on foam roll   Theraband Level (Shoulder Horizontal ABduction) Level 3 (Green)   External Rotation Strengthening;Both;20 reps;Theraband  on foam roll   Theraband Level (Shoulder External Rotation) Level 3 (Green)   Other Supine Exercises Y, W on physioball with postural cues - 10x each way     Shoulder Exercises:  ROM/Strengthening   UBE (Upper Arm Bike) L5 3x3 with postural focus and initial verbal cues only    Other ROM/Strengthening Exercises pec stretch on foam roll     Shoulder Exercises: Power Tower   Extension 20 reps   Extension Limitations 25 lb   Retraction 20 reps   Retraction Limitations 25 lb   External Rotation 20 reps  20 lb   Internal Rotation 20 reps  20 lb     Modalities   Modalities Iontophoresis     Iontophoresis   Type of Iontophoresis Dexamethasone   Location anterior humeral head left shoulder  #1   Dose 1 mL   Time 4-6 hour release     Manual Therapy   Manual Therapy Soft tissue mobilization   Manual therapy comments seated   Soft tissue mobilization left upper trap                  PT Short Term Goals - 10/06/16 0804      PT SHORT TERM GOAL #2   Title report < or = to 3/10 Lt shoulder pain with reaching overhead   Time 4   Period Weeks   Status Achieved           PT Long Term Goals - 10/06/16 [id number]      PT LONG TERM GOAL #  1   Title be independent in advanced HEP   Time 8   Period Weeks   Status On-going     PT LONG TERM GOAL #2   Title reduce FOTO to < or = to 27% limitation   Time 8   Period Weeks   Status On-going     PT LONG TERM GOAL #3   Title demonstrate full Lt shoulder P/ROM to improve use of Lt UE   Time 8   Period Weeks   Status On-going     PT LONG TERM GOAL #4   Title report no sleep limitations due to Lt shoulder pain   Time 8   Period Weeks   Status On-going     PT LONG TERM GOAL #5   Title report < or = to 1/10 Lt shoulder pain with overhead use   Time 8   Period Weeks   Status On-going               Plan - 10/06/16 0804    Clinical Impression Statement Patient needs cues for postural strength.  He was able to perform correctly.  Noticed scapular winging with exercises on physioball and increased fatigue with shoulder external rotation.  Ionto treatment provided on RTC attatchment site of left  shoulder and pt educated to remove after 4-6 hours or prior to that if skin becomes irritated. Pt needs skilled therapy strength and posture.   PT Treatment/Interventions ADLs/Self Care Home Management;Electrical Stimulation;Cryotherapy;Iontophoresis 4mg /ml Dexamethasone;Functional mobility training;Ultrasound;Moist Heat;Therapeutic activities;Therapeutic exercise;Neuromuscular re-education;Passive range of motion;Dry needling;Taping   PT Next Visit Plan ionto #2, continue chest opening and postural strength, RTC and serratus exercises add to HEP as needed   Consulted and Agree with Plan of Care Patient      Patient will benefit from skilled therapeutic intervention in order to improve the following deficits and impairments:  Postural dysfunction, Decreased strength, Impaired flexibility, Pain, Decreased activity tolerance, Decreased range of motion, Impaired vision/preception  Visit Diagnosis: Acute pain of left shoulder  Abnormal posture  Stiffness of left shoulder, not elsewhere classified     Problem List There are no active problems to display for this patient.   Vincente Poli , PT 10/06/2016, 8:50 AM  [facility] [address], [address] [place], [place], [address] Phone: [phone]   Fax:  [phone]  Name: ROSS BENDER MRN: [id number] Date of Birth: May 20, 1970

## 2016-10-13 ENCOUNTER — Ambulatory Visit: Payer: BLUE CROSS/BLUE SHIELD | Admitting: Physical Therapy

## 2016-10-13 ENCOUNTER — Encounter: Payer: Self-pay | Admitting: Physical Therapy

## 2016-10-13 DIAGNOSIS — M25512 Pain in left shoulder: Secondary | ICD-10-CM

## 2016-10-13 DIAGNOSIS — M25612 Stiffness of left shoulder, not elsewhere classified: Secondary | ICD-10-CM

## 2016-10-13 DIAGNOSIS — R293 Abnormal posture: Secondary | ICD-10-CM

## 2016-10-13 NOTE — Therapy (Signed)
[facility] [address], [address] [place], [place], [address] Phone: [phone]   Fax:  [phone]  Physical Therapy Treatment  Patient Details  Name: Kevin [name] MRN: [id number] Date of Birth: Jan 05, 1971 Referring Provider: Darrow Bussing, MD  Encounter Date: 10/13/2016      PT End of Session - 10/13/16 0801    Visit Number 5   Date for PT Re-Evaluation 11/11/16   PT Start Time 0800   PT Stop Time 0843   PT Time Calculation (min) 43 min   Activity Tolerance Patient tolerated treatment well   Behavior During Therapy [facility] for tasks assessed/performed      Past Medical History:  Diagnosis Date  . Hyperlipidemia   . Hypertension     History reviewed. No pertinent surgical history.  There were no vitals filed for this visit.      Subjective Assessment - 10/13/16 0802    Subjective Since eval I feel 50%-75% improved. I have been sleeping a lot lately due to migraines so this did not help the shoulder this past week.    Currently in Pain? Yes   Pain Score 3    Pain Location Shoulder   Pain Orientation Left   Aggravating Factors  Sleeping on it, mowing   Pain Relieving Factors exercises, stretching   Multiple Pain Sites No            [facility] PT Assessment - 10/13/16 0001      PROM   Overall PROM Comments Lt shoulder flexion: 140                     [facility] Adult PT Treatment/Exercise - 10/13/16 0001      Neck Exercises: Machines for Strengthening   UBE (Upper Arm Bike) L5 5 min reverse PTA concurrent review of status     Shoulder Exercises: Supine   Protraction --  3# Lt on foam roll 3x10   Other Supine Exercises 1 min decompression/collar bone openier on foam roll, then green ban scap unattached 2x 15 each   Other Supine Exercises shld flexion stretch overhead with cane 10x     Shoulder Exercises: Seated   External Rotation Strengthening;Both;Theraband  3 x10   Theraband Level  (Shoulder External Rotation) Level 3 (Green)     Shoulder Exercises: Prone   Horizontal ABduction 1 Strengthening;Left;20 reps;Weights   Horizontal ABduction 1 Weight (lbs) 1  VC for scapular depression     Shoulder Exercises: Stretch   Other Shoulder Stretches Horizontal foam roll thoracic extensions 10x     Shoulder Exercises: Power Radiation protection practitioner 20 reps   Extension Limitations 25#   Retraction 20 reps   Retraction Limitations 25#     Iontophoresis   Type of Iontophoresis Dexamethasone   Location anterior humeral head left shoulder  #2   Dose 1 ml skin intact   Time 6 hr  Pt understands wear time                  PT Short Term Goals - 10/13/16 0815      PT SHORT TERM GOAL #3   Title report waking < or = to 1x at night due to pain   Time 4   Period Weeks           PT Long Term Goals - 10/13/16 6962      PT LONG TERM GOAL #1   Title be independent in advanced HEP   Period  Weeks   Status On-going               Plan - 10/13/16 0816    Clinical Impression Statement Pt reports he is 50%-75% improved since the eval. Lately he has had some migraine issues due to allergy medication. This makes him lay/sleep for long periods of time and has made the shoulder feel more sore in the last 2 days. Heavier jobs like mowing  and overhead reaching still difficult with some pain. Pt tolerated all increases in loads for strength without issue.  Passive Lt shlouder flexion has improved to 140 degrees.    Rehab Potential Good   PT Duration 8 weeks   PT Treatment/Interventions ADLs/Self Care Home Management;Electrical Stimulation;Cryotherapy;Iontophoresis 4mg /ml Dexamethasone;Functional mobility training;Ultrasound;Moist Heat;Therapeutic activities;Therapeutic exercise;Neuromuscular re-education;Passive range of motion;Dry needling;Taping   PT Next Visit Plan ionto #2, continue chest opening and postural strength, RTC and serratus exercises add to HEP as needed    Consulted and Agree with Plan of Care Patient      Patient will benefit from skilled therapeutic intervention in order to improve the following deficits and impairments:  Postural dysfunction, Decreased strength, Impaired flexibility, Pain, Decreased activity tolerance, Decreased range of motion, Impaired vision/preception  Visit Diagnosis: Acute pain of left shoulder  Abnormal posture  Stiffness of left shoulder, not elsewhere classified     Problem List There are no active problems to display for this patient.   Pavle Wiler, PTA 10/13/2016, 8:41 AM  [facility] [address], [address] [place], [place], [address] Phone: [phone]   Fax:  [phone]  Name: Kevin [name] MRN: [id number] Date of Birth: [birth year]

## 2016-10-20 ENCOUNTER — Encounter: Payer: Self-pay | Admitting: Physical Therapy

## 2016-10-20 ENCOUNTER — Ambulatory Visit: Payer: BLUE CROSS/BLUE SHIELD | Admitting: Physical Therapy

## 2016-10-20 DIAGNOSIS — M25512 Pain in left shoulder: Secondary | ICD-10-CM | POA: Diagnosis not present

## 2016-10-20 DIAGNOSIS — M25612 Stiffness of left shoulder, not elsewhere classified: Secondary | ICD-10-CM | POA: Diagnosis not present

## 2016-10-20 DIAGNOSIS — R293 Abnormal posture: Secondary | ICD-10-CM | POA: Diagnosis not present

## 2016-10-20 NOTE — Therapy (Signed)
[facility] [address], [place], [place], [address] Phone: [phone]   Fax:  [phone]  Physical Therapy Treatment  Patient Details  Name: Kevin [name] MRN: [id number] Date of Birth: 12-25-1970 Referring Provider: Lujean Amel, MD  Encounter Date: 10/20/2016      PT End of Session - 10/20/16 0806    Visit Number 6   Date for PT Re-Evaluation 11/11/16   PT Start Time 0801   PT Stop Time 0841   PT Time Calculation (min) 40 min   Activity Tolerance Patient tolerated treatment well   Behavior During Therapy [facility] for tasks assessed/performed      Past Medical History:  Diagnosis Date  . Hyperlipidemia   . Hypertension     History reviewed. No pertinent surgical history.  There were no vitals filed for this visit.      Subjective Assessment - 10/20/16 0804    Subjective I was at the ball game last night and catching balls with my son.  At the end of the night it was starting to be a little sore.     Patient Stated Goals reduce Lt shoulder, use Lt UE overhead, play basketball   Currently in Pain? No/denies                         [facility] Adult PT Treatment/Exercise - 10/20/16 0001      Shoulder Exercises: Supine   Protraction Strengthening;Left;15 reps;Weights  on foam roal, serratus punches   Protraction Weight (lbs) 5   Horizontal ABduction Strengthening;Both;20 reps;Theraband   Theraband Level (Shoulder Horizontal ABduction) Level 3 (Green)   External Rotation Strengthening;Both;20 reps;Theraband  on foam roll   Theraband Level (Shoulder External Rotation) Level 3 (Green)   Other Supine Exercises D2 extension left, on foam roller - red band - 20x     Shoulder Exercises: Standing   External Rotation Strengthening;Left;20 reps;Theraband  in 45 deg aBduction   Theraband Level (Shoulder External Rotation) Level 1 (Yellow)     Shoulder Exercises: ROM/Strengthening   UBE (Upper  Arm Bike) L5 3x3 with postural focus and initial verbal cues only   PT present to discuss treatment   Other ROM/Strengthening Exercises wall slide with towel - Lt UE flexion     Shoulder Exercises: Power Tower   Extension 15 reps  2 sets   Extension Limitations 25#   Retraction 15 reps  2 sets   Retraction Limitations 25#   External Rotation 15 reps  2 sets   External Rotation Limitations 20#   Internal Rotation 15 reps  2 sets   Internal Rotation Limitations 20#     Iontophoresis   Type of Iontophoresis Dexamethasone   Location anterior humeral head left shoulder  #3   Dose 1 ml skin intact   Time 6 hr  Pt understands wear time                  PT Short Term Goals - 10/20/16 7867      PT SHORT TERM GOAL #3   Title report waking < or = to 1x at night due to pain   Baseline not more than 1x   Time 4   Period Weeks   Status Achieved           PT Long Term Goals - 10/20/16 6720      PT LONG TERM GOAL #1   Title be independent in advanced HEP  Time 8   Period Weeks   Status On-going     PT LONG TERM GOAL #2   Title reduce FOTO to < or = to 27% limitation   Time 8   Period Weeks   Status On-going     PT LONG TERM GOAL #4   Title report no sleep limitations due to Lt shoulder pain   Baseline did not wake last night, was very tired   Time 8   Period Weeks   Status On-going               Plan - 10/20/16 0806    Clinical Impression Statement Patient is doing well and has met short term goal of improved sleep due to reduction in pain.  Pt has increased his exercises at home since previous treatment.  Pt continues to have decreased endurance with exercises especially external rotation.  Skilled PT needed for imprved shoulder stability and modalities to decrease inflammation.   Rehab Potential Good   PT Treatment/Interventions ADLs/Self Care Home Management;Electrical Stimulation;Cryotherapy;Iontophoresis 77m/ml Dexamethasone;Functional  mobility training;Ultrasound;Moist Heat;Therapeutic activities;Therapeutic exercise;Neuromuscular re-education;Passive range of motion;Dry needling;Taping   PT Next Visit Plan ionto #4, continue chest opening and postural strength, RTC and serratus exercises add to HEP as needed   Consulted and Agree with Plan of Care Patient      Patient will benefit from skilled therapeutic intervention in order to improve the following deficits and impairments:  Postural dysfunction, Decreased strength, Impaired flexibility, Pain, Decreased activity tolerance, Decreased range of motion, Impaired vision/preception  Visit Diagnosis: Acute pain of left shoulder  Abnormal posture  Stiffness of left shoulder, not elsewhere classified     Problem List There are no active problems to display for this patient.   JZannie Cove PT 10/20/2016, 8:43 AM  [facility] [address] [place] [address]Phone: [phone]  Fax:  [phone] Name: Kevin SAPPENFIELDMRN: [id number] of Birth: [birth year]

## 2016-10-27 ENCOUNTER — Ambulatory Visit: Payer: BLUE CROSS/BLUE SHIELD

## 2016-10-27 DIAGNOSIS — R293 Abnormal posture: Secondary | ICD-10-CM | POA: Diagnosis not present

## 2016-10-27 DIAGNOSIS — M25612 Stiffness of left shoulder, not elsewhere classified: Secondary | ICD-10-CM

## 2016-10-27 DIAGNOSIS — M25512 Pain in left shoulder: Secondary | ICD-10-CM

## 2016-10-27 NOTE — Therapy (Signed)
[facility] [address], [address] [place], [place], [address] Phone: [phone]   Fax:  [phone]  Physical Therapy Treatment  Patient Details  Name: Kevin [name] MRN: [id number] Date of Birth: April 23, 1971 Referring Provider: Darrow BussingKoirala, Dibas, MD  Encounter Date: 10/27/2016      PT End of Session - 10/27/16 1525    Visit Number 7   Date for PT Re-Evaluation 11/11/16   PT Start Time 1444   PT Stop Time 1525   PT Time Calculation (min) 41 min   Activity Tolerance Patient tolerated treatment well   Behavior During Therapy [facility] for tasks assessed/performed      Past Medical History:  Diagnosis Date  . Hyperlipidemia   . Hypertension     History reviewed. No pertinent surgical history.  There were no vitals filed for this visit.      Subjective Assessment - 10/27/16 1454    Subjective 60-80% overall improvement in symptoms since the start of care.  I notice that my Lt shoulder is weaker.    Currently in Pain? No/denies  3-4/10 max   Pain Location Shoulder   Pain Orientation Left   Pain Descriptors / Indicators Dull                         [facility] Adult PT Treatment/Exercise - 10/27/16 0001      Shoulder Exercises: Supine   Protraction Strengthening;Left;15 reps;Weights  on foam roal, serratus punches   Protraction Weight (lbs) 5   Horizontal ABduction Strengthening;Both;20 reps;Theraband   Theraband Level (Shoulder Horizontal ABduction) Level 3 (Green)   External Rotation Strengthening;Both;20 reps;Theraband  on foam roll   Theraband Level (Shoulder External Rotation) Level 3 (Green)   Other Supine Exercises D2 extension left, on foam roller - red band - 20x     Shoulder Exercises: Seated   External Rotation Strengthening;Both;Theraband  3 x10   Theraband Level (Shoulder External Rotation) Level 3 (Green)   Flexion Strengthening;Left;20 reps;Weights   Flexion Weight (lbs) 3   Abduction  Strengthening;Left;20 reps;Weights   ABduction Weight (lbs) 3     Shoulder Exercises: Standing   Other Standing Exercises snow angels      Shoulder Exercises: Power Tower   Extension 15 reps  2 sets   Extension Limitations 25#   Row 20 reps   Row Limitations 35# 2x10   External Rotation 15 reps  2 sets   External Rotation Limitations 25#   Internal Rotation 15 reps  2 sets   Internal Rotation Limitations 25#     Iontophoresis   Type of Iontophoresis Dexamethasone   Location anterior humeral head left shoulder  #4   Dose 1 ml skin intact   Time 6 hr                PT Education - 10/27/16 1525    Education provided Yes   Education Details ionto info   Person(s) Educated Patient   Methods Explanation;Handout   Comprehension Verbalized understanding          PT Short Term Goals - 10/20/16 0808      PT SHORT TERM GOAL #3   Title report waking < or = to 1x at night due to pain   Baseline not more than 1x   Time 4   Period Weeks   Status Achieved           PT Long Term Goals - 10/27/16 1454  PT LONG TERM GOAL #1   Title be independent in advanced HEP   Time 8   Period Weeks   Status On-going     PT LONG TERM GOAL #4   Title report no sleep limitations due to Lt shoulder pain   Baseline if on the Lt side   Time 8   Period Weeks   Status On-going               Plan - 10/27/16 1504    Clinical Impression Statement Pt reports 60-80% overall improvement in Lt shoulder symptoms since the start of care.  Pt remains weak oin the Lt shoulder and has difficulty with increased reps with resisted exercise.  Pt with stiffness at end range of Lt shoulder motions.  Pt wakes when he is on the Lt side although this is infrequent.  Pt will continue to benefit from skilled PT for Lt shoulder strength, endurance and flexibility.     Rehab Potential Good   PT Frequency 2x / week   PT Duration 8 weeks   PT Treatment/Interventions ADLs/Self Care Home  Management;Electrical Stimulation;Cryotherapy;Iontophoresis 4mg /ml Dexamethasone;Functional mobility training;Ultrasound;Moist Heat;Therapeutic activities;Therapeutic exercise;Neuromuscular re-education;Passive range of motion;Dry needling;Taping   PT Next Visit Plan ionto #5, continue chest opening and postural strength, RTC and serratus exercises add to HEP as needed   Recommended Other Services PT initial plan of care is signed   Consulted and Agree with Plan of Care Patient      Patient will benefit from skilled therapeutic intervention in order to improve the following deficits and impairments:  Postural dysfunction, Decreased strength, Impaired flexibility, Pain, Decreased activity tolerance, Decreased range of motion, Impaired vision/preception  Visit Diagnosis: Acute pain of left shoulder  Abnormal posture  Stiffness of left shoulder, not elsewhere classified     Problem List There are no active problems to display for this patient.    Lorrene Reid, PT 10/27/16 3:26 PM  [facility] [address] [place], [place], [address] Phone: [phone]   Fax:  [phone]  Name: Kevin [name] MRN: [id number] Date of Birth: 1970/11/19

## 2016-10-27 NOTE — Patient Instructions (Addendum)

## 2016-11-02 ENCOUNTER — Ambulatory Visit: Payer: BLUE CROSS/BLUE SHIELD | Attending: Family Medicine

## 2016-11-02 DIAGNOSIS — R293 Abnormal posture: Secondary | ICD-10-CM | POA: Insufficient documentation

## 2016-11-02 DIAGNOSIS — M25612 Stiffness of left shoulder, not elsewhere classified: Secondary | ICD-10-CM

## 2016-11-02 DIAGNOSIS — M25512 Pain in left shoulder: Secondary | ICD-10-CM | POA: Insufficient documentation

## 2016-11-02 NOTE — Patient Instructions (Signed)

## 2016-11-02 NOTE — Therapy (Signed)
[facility] [address], [address] [place], [place], [address] Phone: [phone]   Fax:  [phone]  Physical Therapy Treatment  Patient Details  Name: Kevin [name] MRN: [id number] Date of Birth: 1970/06/18 Referring Provider: Darrow Bussing, MD  Encounter Date: 11/02/2016      PT End of Session - 11/02/16 0811    Visit Number 8   Date for PT Re-Evaluation 11/11/16   PT Start Time 0733   PT Stop Time 0812   PT Time Calculation (min) 39 min   Activity Tolerance Patient tolerated treatment well   Behavior During Therapy [facility] for tasks assessed/performed      Past Medical History:  Diagnosis Date  . Hyperlipidemia   . Hypertension     History reviewed. No pertinent surgical history.  There were no vitals filed for this visit.      Subjective Assessment - 11/02/16 0734    Subjective Slept at the hospital because my wife had surgery.  My shoulder hurts more because of this.     Patient Stated Goals reduce Lt shoulder, use Lt UE overhead, play basketball   Currently in Pain? Yes   Pain Score 4    Pain Orientation Left   Pain Descriptors / Indicators Dull   Pain Type Chronic pain   Pain Onset More than a month ago   Aggravating Factors  sleeping on it, use   Pain Relieving Factors exercises, stretching, heat            [facility] PT Assessment - 11/02/16 0001      AROM   Overall AROM Comments 160 degrees Lt shoulder flexion tested in supine                      [facility] Adult PT Treatment/Exercise - 11/02/16 0001      Shoulder Exercises: Supine   Protraction --   Protraction Weight (lbs) --   Horizontal ABduction Strengthening;Both;20 reps;Theraband   Theraband Level (Shoulder Horizontal ABduction) Level 3 (Green)   External Rotation Strengthening;Both;20 reps;Theraband  on foam roll   Theraband Level (Shoulder External Rotation) Level 3 (Green)   Flexion Strengthening;Both;20 reps   Theraband Level (Shoulder Flexion) Level 3 (Green)   Other Supine Exercises D2 extension left, on foam roller - red band - 20x     Shoulder Exercises: Seated   External Rotation Strengthening;Both;Theraband  3 x10   Theraband Level (Shoulder External Rotation) Level 3 (Green)   Flexion Strengthening;Left;20 reps;Weights   Flexion Weight (lbs) 3   Abduction Strengthening;Left;20 reps;Weights   ABduction Weight (lbs) 3     Shoulder Exercises: Standing   Other Standing Exercises snow angels      Shoulder Exercises: ROM/Strengthening   UBE (Upper Arm Bike) L5 3x3 with postural focus and initial verbal cues only   PT present to discuss treatment     Shoulder Exercises: Power Radiation protection practitioner 15 reps  2 sets   Extension Limitations 25#   Row 20 reps   Row Limitations 35# 2x10   External Rotation 15 reps  2 sets   External Rotation Limitations 25#   Internal Rotation 15 reps  2 sets   Internal Rotation Limitations 25#     Iontophoresis   Type of Iontophoresis Dexamethasone   Location anterior humeral head left shoulder  #5   Dose 1 ml skin intact   Time 6 hr  PT Education - 11/02/16 0739    Education provided Yes   Education Details ionto info   Person(s) Educated Patient   Methods Explanation;Handout   Comprehension Verbalized understanding          PT Short Term Goals - 10/20/16 0808      PT SHORT TERM GOAL #3   Title report waking < or = to 1x at night due to pain   Baseline not more than 1x   Time 4   Period Weeks   Status Achieved           PT Long Term Goals - 11/02/16 0743      PT LONG TERM GOAL #1   Title be independent in advanced HEP   Time 8   Period Weeks   Status On-going     PT LONG TERM GOAL #4   Title report no sleep limitations due to Lt shoulder pain   Time 8   Period Weeks   Status On-going               Plan - 11/02/16 0745    Clinical Impression Statement Pt with a flare-up over the weekend  due to sleeping at the hospital in a recliner.  Pt with up to 4/10 Lt shoulder pain with use.  Pt tolerated all exercises in the clinic without increased pain today.  Pt with stiffness in bil pecs and rounded shoulder posture.  Pt with full Lt shoulder A/ROM into flexion.  Pt will continue t obenefit form skilled PT for Lt shoulder strength, A/ROM and posutral strength.     Rehab Potential Good   PT Frequency 2x / week   PT Duration 8 weeks   PT Treatment/Interventions ADLs/Self Care Home Management;Electrical Stimulation;Cryotherapy;Iontophoresis 4mg /ml Dexamethasone;Functional mobility training;Ultrasound;Moist Heat;Therapeutic activities;Therapeutic exercise;Neuromuscular re-education;Passive range of motion;Dry needling;Taping   PT Next Visit Plan ionto #6, continue chest opening and postural strength   Consulted and Agree with Plan of Care Patient      Patient will benefit from skilled therapeutic intervention in order to improve the following deficits and impairments:  Postural dysfunction, Decreased strength, Impaired flexibility, Pain, Decreased activity tolerance, Decreased range of motion, Impaired vision/preception  Visit Diagnosis: Acute pain of left shoulder  Abnormal posture  Stiffness of left shoulder, not elsewhere classified     Problem List There are no active problems to display for this patient.    Lorrene ReidKelly Aeneas Longsworth, PT 11/02/16 8:14 AM  [facility] [address], [address] [place], [place], [address] Phone: [phone]   Fax:  [phone]  Name: Benay SpiceMichael E Reise MRN: [id number] Date of Birth: February 08, 1971

## 2016-11-09 ENCOUNTER — Ambulatory Visit: Payer: BLUE CROSS/BLUE SHIELD

## 2016-11-09 DIAGNOSIS — R293 Abnormal posture: Secondary | ICD-10-CM

## 2016-11-09 DIAGNOSIS — M25612 Stiffness of left shoulder, not elsewhere classified: Secondary | ICD-10-CM

## 2016-11-09 DIAGNOSIS — M25512 Pain in left shoulder: Secondary | ICD-10-CM | POA: Diagnosis not present

## 2016-11-09 NOTE — Patient Instructions (Signed)

## 2016-11-09 NOTE — Therapy (Addendum)
[facility] [address], [place], [place], [address] Phone: [phone]   Fax:  [phone]  Physical Therapy Treatment  Patient Details  Name: Kevin [name] MRN: [id number] Date of Birth: January 02, 1971 Referring Provider: Lujean Amel, MD/Chandler, Larkin Ina, MD  Encounter Date: 11/09/2016      PT End of Session - 11/09/16 0757    Visit Number 9   Date for PT Re-Evaluation 11/11/16   PT Start Time 0729   PT Stop Time 0800   PT Time Calculation (min) 31 min   Activity Tolerance Patient tolerated treatment well   Behavior During Therapy [facility] for tasks assessed/performed      Past Medical History:  Diagnosis Date  . Hyperlipidemia   . Hypertension     History reviewed. No pertinent surgical history.  There were no vitals filed for this visit.      Subjective Assessment - 11/09/16 0733    Subjective Pt will see orthopedic MD this week.  No further PT sessions scheduled.     Currently in Pain? Yes   Pain Score 3    Pain Location Shoulder   Pain Orientation Left   Pain Descriptors / Indicators Dull   Pain Type Chronic pain   Pain Onset More than a month ago   Pain Frequency Intermittent   Aggravating Factors  sleeping on  the Lt side, use of Lt UE   Pain Relieving Factors exercises, stretching, heat            [facility] PT Assessment - 11/09/16 0001      Assessment   Medical Diagnosis strain of Lt shoulder   Referring Provider Lujean Amel, MD/Chandler, Larkin Ina, MD     Cognition   Overall Cognitive Status Within Functional Limits for tasks assessed     Observation/Other Assessments   Focus on Therapeutic Outcomes (FOTO)  37% limited     Posture/Postural Control   Posture/Postural Control Postural limitations   Postural Limitations Rounded Shoulders;Forward head     ROM / Strength   AROM / PROM / Strength PROM;Strength     PROM   Overall PROM  Deficits   PROM Assessment Site Shoulder   Right/Left Shoulder Left   Left Shoulder Flexion 144 Degrees   Left Shoulder ABduction 115 Degrees   Left Shoulder Internal Rotation --  limited by 50%   Left Shoulder External Rotation --  limited by 50%      Strength   Overall Strength Deficits   Left Shoulder Flexion 4-/5  pain   Left Shoulder ABduction 4-/5   Left Shoulder Internal Rotation 4+/5   Left Shoulder External Rotation 4+/5     Palpation   Palpation comment Pt with palpable tenderness over Lt supraspinatus and proximal biceps tendon.  Reduced inferior glide of humeral head     Special Tests    Special Tests Rotator Cuff Impingement   Rotator Cuff Impingment tests Empty Can test     Empty Can test   Findings Positive   Side Left                     [facility] Adult PT Treatment/Exercise - 11/09/16 0001      Shoulder Exercises: Seated   External Rotation Strengthening;Both;Theraband  3 x10   Theraband Level (Shoulder External Rotation) Level 3 (Green)   Flexion Strengthening;Left;20 reps;Weights   Flexion Weight (lbs) 3   Abduction Strengthening;Left;20 reps;Weights   ABduction Weight (lbs) 3     Shoulder Exercises:  Standing   Other Standing Exercises snow angels      Shoulder Exercises: ROM/Strengthening   UBE (Upper Arm Bike) L5 3x3 with postural focus and initial verbal cues only   PT present to discuss treatment     Shoulder Exercises: Power Development worker, community 15 reps  2 sets   Extension Limitations 25#   Row 20 reps   Row Limitations 35# 2x10   External Rotation 15 reps  2 sets   External Rotation Limitations 25#   Internal Rotation 15 reps  2 sets   Internal Rotation Limitations 25#     Iontophoresis   Type of Iontophoresis Dexamethasone   Location anterior humeral head left shoulder  #6   Dose 1 ml skin intact   Time 6 hr                PT Education - 11/09/16 0743    Education provided Yes   Education Details ionto info   Person(s) Educated Patient   Methods  Explanation;Handout   Comprehension Verbalized understanding;Returned demonstration          PT Short Term Goals - 10/20/16 0808      PT SHORT TERM GOAL #3   Title report waking < or = to 1x at night due to pain   Baseline not more than 1x   Time 4   Period Weeks   Status Achieved           PT Long Term Goals - 11/09/16 0734      PT LONG TERM GOAL #1   Title be independent in advanced HEP   Time 8   Period Weeks   Status On-going     PT LONG TERM GOAL #2   Title reduce FOTO to < or = to 27% limitation   Baseline 37% limitation   Time 8   Period Weeks   Status On-going     PT LONG TERM GOAL #3   Title demonstrate full Lt shoulder P/ROM to improve use of Lt UE   Time 8   Period Weeks   Status On-going     PT LONG TERM GOAL #4   Title report no sleep limitations due to Lt shoulder pain   Baseline if on the Lt side   Time 8   Period Weeks   Status On-going     PT LONG TERM GOAL #5   Title report < or = to 1/10 Lt shoulder pain with overhead use   Baseline up to 4/10   Time 8   Period Weeks   Status On-going               Plan - 11/09/16 0755    Clinical Impression Statement Pt with continued Lt shoulder pain especially with use overhead and in midrange.  Pt reports 75% overall improvement since the start of care and is limited with reaching overhead.  Pt with forward shoulders, limited P/ROM, limited strength and positive empty can test. Pt will see Ortho MD this week for further assessment.     Rehab Potential Good   PT Frequency 2x / week   PT Duration 8 weeks   PT Treatment/Interventions ADLs/Self Care Home Management;Electrical Stimulation;Cryotherapy;Iontophoresis '4mg'$ /ml Dexamethasone;Functional mobility training;Ultrasound;Moist Heat;Therapeutic activities;Therapeutic exercise;Neuromuscular re-education;Passive range of motion;Dry needling;Taping   PT Next Visit Plan Hold chart and see what MD says.  If pt returns, ERO needed.     Consulted  and Agree with Plan of Care Patient  Patient will benefit from skilled therapeutic intervention in order to improve the following deficits and impairments:  Postural dysfunction, Decreased strength, Impaired flexibility, Pain, Decreased activity tolerance, Decreased range of motion, Impaired vision/preception  Visit Diagnosis: Acute pain of left shoulder  Abnormal posture  Stiffness of left shoulder, not elsewhere classified     Problem List There are no active problems to display for this patient.   Sigurd Sos, PT 11/09/16 1:05 PM PHYSICAL THERAPY DISCHARGE SUMMARY  Visits from Start of Care: 9  Current functional level related to goals / functional outcomes: See above for current status.  Pt was going to see orthopedic MD to discuss options.     Remaining deficits: See above.   Education / Equipment: HEP, posture Plan: Patient agrees to discharge.  Patient goals were partially met. Patient is being discharged due to not returning since the last visit.  ?????       Sigurd Sos, PT 11/24/16 1:46 PM   [facility] [address] [name], [place], [address] Phone: [phone]   Fax:  [phone]  Name: Kevin [name] MRN: [id number] Date of Birth: 1970-11-10

## 2016-11-12 DIAGNOSIS — M67912 Unspecified disorder of synovium and tendon, left shoulder: Secondary | ICD-10-CM | POA: Diagnosis not present

## 2016-11-16 ENCOUNTER — Other Ambulatory Visit: Payer: Self-pay | Admitting: Orthopedic Surgery

## 2016-11-16 DIAGNOSIS — M75102 Unspecified rotator cuff tear or rupture of left shoulder, not specified as traumatic: Secondary | ICD-10-CM

## 2016-11-28 ENCOUNTER — Ambulatory Visit
Admission: RE | Admit: 2016-11-28 | Discharge: 2016-11-28 | Disposition: A | Discharge status: Home / Self Care | Payer: BLUE CROSS/BLUE SHIELD | Source: Ambulatory Visit | Attending: Orthopedic Surgery | Admitting: Orthopedic Surgery

## 2016-11-28 DIAGNOSIS — M19012 Primary osteoarthritis, left shoulder: Secondary | ICD-10-CM | POA: Diagnosis not present

## 2016-11-28 DIAGNOSIS — M75102 Unspecified rotator cuff tear or rupture of left shoulder, not specified as traumatic: Secondary | ICD-10-CM

## 2016-12-01 DIAGNOSIS — M75112 Incomplete rotator cuff tear or rupture of left shoulder, not specified as traumatic: Secondary | ICD-10-CM | POA: Diagnosis not present

## 2016-12-21 DIAGNOSIS — X58XXXA Exposure to other specified factors, initial encounter: Secondary | ICD-10-CM | POA: Diagnosis not present

## 2016-12-21 DIAGNOSIS — S46012S Strain of muscle(s) and tendon(s) of the rotator cuff of left shoulder, sequela: Secondary | ICD-10-CM | POA: Diagnosis not present

## 2016-12-21 DIAGNOSIS — S43422A Sprain of left rotator cuff capsule, initial encounter: Secondary | ICD-10-CM | POA: Diagnosis not present

## 2016-12-21 DIAGNOSIS — M7542 Impingement syndrome of left shoulder: Secondary | ICD-10-CM | POA: Diagnosis not present

## 2016-12-21 DIAGNOSIS — G8918 Other acute postprocedural pain: Secondary | ICD-10-CM | POA: Diagnosis not present

## 2016-12-29 DIAGNOSIS — Z9889 Other specified postprocedural states: Secondary | ICD-10-CM | POA: Diagnosis not present

## 2016-12-30 DIAGNOSIS — M25512 Pain in left shoulder: Secondary | ICD-10-CM | POA: Diagnosis not present

## 2016-12-30 DIAGNOSIS — M6281 Muscle weakness (generalized): Secondary | ICD-10-CM | POA: Diagnosis not present

## 2016-12-30 DIAGNOSIS — M25112 Fistula, left shoulder: Secondary | ICD-10-CM | POA: Diagnosis not present

## 2016-12-30 DIAGNOSIS — M25612 Stiffness of left shoulder, not elsewhere classified: Secondary | ICD-10-CM | POA: Diagnosis not present

## 2017-01-10 DIAGNOSIS — M25512 Pain in left shoulder: Secondary | ICD-10-CM | POA: Diagnosis not present

## 2017-01-10 DIAGNOSIS — M75112 Incomplete rotator cuff tear or rupture of left shoulder, not specified as traumatic: Secondary | ICD-10-CM | POA: Diagnosis not present

## 2017-01-10 DIAGNOSIS — M6281 Muscle weakness (generalized): Secondary | ICD-10-CM | POA: Diagnosis not present

## 2017-01-10 DIAGNOSIS — M25612 Stiffness of left shoulder, not elsewhere classified: Secondary | ICD-10-CM | POA: Diagnosis not present

## 2017-01-12 DIAGNOSIS — M6281 Muscle weakness (generalized): Secondary | ICD-10-CM | POA: Diagnosis not present

## 2017-01-12 DIAGNOSIS — M25612 Stiffness of left shoulder, not elsewhere classified: Secondary | ICD-10-CM | POA: Diagnosis not present

## 2017-01-12 DIAGNOSIS — M25512 Pain in left shoulder: Secondary | ICD-10-CM | POA: Diagnosis not present

## 2017-01-12 DIAGNOSIS — M75112 Incomplete rotator cuff tear or rupture of left shoulder, not specified as traumatic: Secondary | ICD-10-CM | POA: Diagnosis not present

## 2017-01-17 DIAGNOSIS — M25512 Pain in left shoulder: Secondary | ICD-10-CM | POA: Diagnosis not present

## 2017-01-17 DIAGNOSIS — M75112 Incomplete rotator cuff tear or rupture of left shoulder, not specified as traumatic: Secondary | ICD-10-CM | POA: Diagnosis not present

## 2017-01-17 DIAGNOSIS — M6281 Muscle weakness (generalized): Secondary | ICD-10-CM | POA: Diagnosis not present

## 2017-01-17 DIAGNOSIS — M25612 Stiffness of left shoulder, not elsewhere classified: Secondary | ICD-10-CM | POA: Diagnosis not present

## 2017-01-19 DIAGNOSIS — M75112 Incomplete rotator cuff tear or rupture of left shoulder, not specified as traumatic: Secondary | ICD-10-CM | POA: Diagnosis not present

## 2017-01-19 DIAGNOSIS — M6281 Muscle weakness (generalized): Secondary | ICD-10-CM | POA: Diagnosis not present

## 2017-01-19 DIAGNOSIS — M25612 Stiffness of left shoulder, not elsewhere classified: Secondary | ICD-10-CM | POA: Diagnosis not present

## 2017-01-19 DIAGNOSIS — M25512 Pain in left shoulder: Secondary | ICD-10-CM | POA: Diagnosis not present

## 2017-01-24 DIAGNOSIS — M25512 Pain in left shoulder: Secondary | ICD-10-CM | POA: Diagnosis not present

## 2017-01-24 DIAGNOSIS — M25612 Stiffness of left shoulder, not elsewhere classified: Secondary | ICD-10-CM | POA: Diagnosis not present

## 2017-01-24 DIAGNOSIS — M6281 Muscle weakness (generalized): Secondary | ICD-10-CM | POA: Diagnosis not present

## 2017-01-24 DIAGNOSIS — M75112 Incomplete rotator cuff tear or rupture of left shoulder, not specified as traumatic: Secondary | ICD-10-CM | POA: Diagnosis not present

## 2017-01-26 DIAGNOSIS — M75112 Incomplete rotator cuff tear or rupture of left shoulder, not specified as traumatic: Secondary | ICD-10-CM | POA: Diagnosis not present

## 2017-01-26 DIAGNOSIS — M25512 Pain in left shoulder: Secondary | ICD-10-CM | POA: Diagnosis not present

## 2017-01-26 DIAGNOSIS — M25612 Stiffness of left shoulder, not elsewhere classified: Secondary | ICD-10-CM | POA: Diagnosis not present

## 2017-01-26 DIAGNOSIS — M6281 Muscle weakness (generalized): Secondary | ICD-10-CM | POA: Diagnosis not present

## 2017-01-31 DIAGNOSIS — M25612 Stiffness of left shoulder, not elsewhere classified: Secondary | ICD-10-CM | POA: Diagnosis not present

## 2017-01-31 DIAGNOSIS — M75112 Incomplete rotator cuff tear or rupture of left shoulder, not specified as traumatic: Secondary | ICD-10-CM | POA: Diagnosis not present

## 2017-01-31 DIAGNOSIS — M6281 Muscle weakness (generalized): Secondary | ICD-10-CM | POA: Diagnosis not present

## 2017-01-31 DIAGNOSIS — M25512 Pain in left shoulder: Secondary | ICD-10-CM | POA: Diagnosis not present

## 2017-02-02 DIAGNOSIS — Z9889 Other specified postprocedural states: Secondary | ICD-10-CM | POA: Diagnosis not present

## 2017-02-02 DIAGNOSIS — M25612 Stiffness of left shoulder, not elsewhere classified: Secondary | ICD-10-CM | POA: Diagnosis not present

## 2017-02-02 DIAGNOSIS — M6281 Muscle weakness (generalized): Secondary | ICD-10-CM | POA: Diagnosis not present

## 2017-02-02 DIAGNOSIS — M25512 Pain in left shoulder: Secondary | ICD-10-CM | POA: Diagnosis not present

## 2017-02-02 DIAGNOSIS — M75112 Incomplete rotator cuff tear or rupture of left shoulder, not specified as traumatic: Secondary | ICD-10-CM | POA: Diagnosis not present

## 2017-02-07 DIAGNOSIS — M25612 Stiffness of left shoulder, not elsewhere classified: Secondary | ICD-10-CM | POA: Diagnosis not present

## 2017-02-07 DIAGNOSIS — M75112 Incomplete rotator cuff tear or rupture of left shoulder, not specified as traumatic: Secondary | ICD-10-CM | POA: Diagnosis not present

## 2017-02-07 DIAGNOSIS — M6281 Muscle weakness (generalized): Secondary | ICD-10-CM | POA: Diagnosis not present

## 2017-02-07 DIAGNOSIS — M25512 Pain in left shoulder: Secondary | ICD-10-CM | POA: Diagnosis not present

## 2017-02-14 DIAGNOSIS — M25512 Pain in left shoulder: Secondary | ICD-10-CM | POA: Diagnosis not present

## 2017-02-14 DIAGNOSIS — M75112 Incomplete rotator cuff tear or rupture of left shoulder, not specified as traumatic: Secondary | ICD-10-CM | POA: Diagnosis not present

## 2017-02-14 DIAGNOSIS — M6281 Muscle weakness (generalized): Secondary | ICD-10-CM | POA: Diagnosis not present

## 2017-02-14 DIAGNOSIS — M25612 Stiffness of left shoulder, not elsewhere classified: Secondary | ICD-10-CM | POA: Diagnosis not present

## 2017-02-21 DIAGNOSIS — M25512 Pain in left shoulder: Secondary | ICD-10-CM | POA: Diagnosis not present

## 2017-02-21 DIAGNOSIS — M25612 Stiffness of left shoulder, not elsewhere classified: Secondary | ICD-10-CM | POA: Diagnosis not present

## 2017-02-21 DIAGNOSIS — M75112 Incomplete rotator cuff tear or rupture of left shoulder, not specified as traumatic: Secondary | ICD-10-CM | POA: Diagnosis not present

## 2017-02-21 DIAGNOSIS — M6281 Muscle weakness (generalized): Secondary | ICD-10-CM | POA: Diagnosis not present

## 2017-02-28 DIAGNOSIS — M25612 Stiffness of left shoulder, not elsewhere classified: Secondary | ICD-10-CM | POA: Diagnosis not present

## 2017-02-28 DIAGNOSIS — M6281 Muscle weakness (generalized): Secondary | ICD-10-CM | POA: Diagnosis not present

## 2017-02-28 DIAGNOSIS — M25512 Pain in left shoulder: Secondary | ICD-10-CM | POA: Diagnosis not present

## 2017-02-28 DIAGNOSIS — M75112 Incomplete rotator cuff tear or rupture of left shoulder, not specified as traumatic: Secondary | ICD-10-CM | POA: Diagnosis not present

## 2017-03-03 DIAGNOSIS — R7303 Prediabetes: Secondary | ICD-10-CM | POA: Diagnosis not present

## 2017-03-07 DIAGNOSIS — M6281 Muscle weakness (generalized): Secondary | ICD-10-CM | POA: Diagnosis not present

## 2017-03-07 DIAGNOSIS — M25612 Stiffness of left shoulder, not elsewhere classified: Secondary | ICD-10-CM | POA: Diagnosis not present

## 2017-03-07 DIAGNOSIS — M25512 Pain in left shoulder: Secondary | ICD-10-CM | POA: Diagnosis not present

## 2017-03-07 DIAGNOSIS — M75112 Incomplete rotator cuff tear or rupture of left shoulder, not specified as traumatic: Secondary | ICD-10-CM | POA: Diagnosis not present

## 2017-03-14 DIAGNOSIS — M6281 Muscle weakness (generalized): Secondary | ICD-10-CM | POA: Diagnosis not present

## 2017-03-14 DIAGNOSIS — M25612 Stiffness of left shoulder, not elsewhere classified: Secondary | ICD-10-CM | POA: Diagnosis not present

## 2017-03-14 DIAGNOSIS — M75112 Incomplete rotator cuff tear or rupture of left shoulder, not specified as traumatic: Secondary | ICD-10-CM | POA: Diagnosis not present

## 2017-03-14 DIAGNOSIS — M25512 Pain in left shoulder: Secondary | ICD-10-CM | POA: Diagnosis not present

## 2017-03-16 DIAGNOSIS — Z9889 Other specified postprocedural states: Secondary | ICD-10-CM | POA: Diagnosis not present

## 2017-03-22 DIAGNOSIS — M25612 Stiffness of left shoulder, not elsewhere classified: Secondary | ICD-10-CM | POA: Diagnosis not present

## 2017-03-22 DIAGNOSIS — M25512 Pain in left shoulder: Secondary | ICD-10-CM | POA: Diagnosis not present

## 2017-03-22 DIAGNOSIS — M75112 Incomplete rotator cuff tear or rupture of left shoulder, not specified as traumatic: Secondary | ICD-10-CM | POA: Diagnosis not present

## 2017-03-22 DIAGNOSIS — M6281 Muscle weakness (generalized): Secondary | ICD-10-CM | POA: Diagnosis not present

## 2017-03-29 DIAGNOSIS — M25512 Pain in left shoulder: Secondary | ICD-10-CM | POA: Diagnosis not present

## 2017-03-29 DIAGNOSIS — M6281 Muscle weakness (generalized): Secondary | ICD-10-CM | POA: Diagnosis not present

## 2017-03-29 DIAGNOSIS — M75112 Incomplete rotator cuff tear or rupture of left shoulder, not specified as traumatic: Secondary | ICD-10-CM | POA: Diagnosis not present

## 2017-03-29 DIAGNOSIS — M25612 Stiffness of left shoulder, not elsewhere classified: Secondary | ICD-10-CM | POA: Diagnosis not present

## 2017-04-06 DIAGNOSIS — M25512 Pain in left shoulder: Secondary | ICD-10-CM | POA: Diagnosis not present

## 2017-04-06 DIAGNOSIS — M25612 Stiffness of left shoulder, not elsewhere classified: Secondary | ICD-10-CM | POA: Diagnosis not present

## 2017-04-06 DIAGNOSIS — M6281 Muscle weakness (generalized): Secondary | ICD-10-CM | POA: Diagnosis not present

## 2017-04-06 DIAGNOSIS — M75112 Incomplete rotator cuff tear or rupture of left shoulder, not specified as traumatic: Secondary | ICD-10-CM | POA: Diagnosis not present

## 2017-04-12 DIAGNOSIS — M75112 Incomplete rotator cuff tear or rupture of left shoulder, not specified as traumatic: Secondary | ICD-10-CM | POA: Diagnosis not present

## 2017-04-12 DIAGNOSIS — M25612 Stiffness of left shoulder, not elsewhere classified: Secondary | ICD-10-CM | POA: Diagnosis not present

## 2017-04-12 DIAGNOSIS — M6281 Muscle weakness (generalized): Secondary | ICD-10-CM | POA: Diagnosis not present

## 2017-04-12 DIAGNOSIS — M25512 Pain in left shoulder: Secondary | ICD-10-CM | POA: Diagnosis not present

## 2017-04-18 DIAGNOSIS — M6281 Muscle weakness (generalized): Secondary | ICD-10-CM | POA: Diagnosis not present

## 2017-04-18 DIAGNOSIS — M25612 Stiffness of left shoulder, not elsewhere classified: Secondary | ICD-10-CM | POA: Diagnosis not present

## 2017-04-18 DIAGNOSIS — M75112 Incomplete rotator cuff tear or rupture of left shoulder, not specified as traumatic: Secondary | ICD-10-CM | POA: Diagnosis not present

## 2017-04-18 DIAGNOSIS — M25512 Pain in left shoulder: Secondary | ICD-10-CM | POA: Diagnosis not present

## 2017-05-11 DIAGNOSIS — M25512 Pain in left shoulder: Secondary | ICD-10-CM | POA: Diagnosis not present

## 2017-12-01 ENCOUNTER — Other Ambulatory Visit: Payer: Self-pay | Admitting: Family Medicine

## 2017-12-01 DIAGNOSIS — R7401 Elevation of levels of liver transaminase levels: Secondary | ICD-10-CM

## 2017-12-01 DIAGNOSIS — R740 Nonspecific elevation of levels of transaminase and lactic acid dehydrogenase [LDH]: Principal | ICD-10-CM

## 2017-12-19 ENCOUNTER — Ambulatory Visit
Admission: RE | Admit: 2017-12-19 | Discharge: 2017-12-19 | Disposition: A | Discharge status: Home / Self Care | Payer: BLUE CROSS/BLUE SHIELD | Source: Ambulatory Visit | Attending: Family Medicine | Admitting: Family Medicine

## 2017-12-19 DIAGNOSIS — R740 Nonspecific elevation of levels of transaminase and lactic acid dehydrogenase [LDH]: Principal | ICD-10-CM

## 2017-12-19 DIAGNOSIS — R7401 Elevation of levels of liver transaminase levels: Secondary | ICD-10-CM

## 2018-03-06 DIAGNOSIS — R7303 Prediabetes: Secondary | ICD-10-CM | POA: Diagnosis not present

## 2018-09-27 DIAGNOSIS — Z0000 Encounter for general adult medical examination without abnormal findings: Secondary | ICD-10-CM | POA: Diagnosis not present

## 2018-09-29 DIAGNOSIS — Z8249 Family history of ischemic heart disease and other diseases of the circulatory system: Secondary | ICD-10-CM | POA: Diagnosis not present

## 2018-09-29 DIAGNOSIS — E7800 Pure hypercholesterolemia, unspecified: Secondary | ICD-10-CM | POA: Diagnosis not present

## 2018-09-29 DIAGNOSIS — R7301 Impaired fasting glucose: Secondary | ICD-10-CM | POA: Diagnosis not present

## 2018-09-29 DIAGNOSIS — Z125 Encounter for screening for malignant neoplasm of prostate: Secondary | ICD-10-CM | POA: Diagnosis not present

## 2018-09-29 DIAGNOSIS — I10 Essential (primary) hypertension: Secondary | ICD-10-CM | POA: Diagnosis not present

## 2019-04-04 DIAGNOSIS — I10 Essential (primary) hypertension: Secondary | ICD-10-CM | POA: Diagnosis not present

## 2019-04-19 DIAGNOSIS — I10 Essential (primary) hypertension: Secondary | ICD-10-CM | POA: Diagnosis not present

## 2019-04-19 DIAGNOSIS — Z23 Encounter for immunization: Secondary | ICD-10-CM | POA: Diagnosis not present

## 2019-04-19 DIAGNOSIS — R7301 Impaired fasting glucose: Secondary | ICD-10-CM | POA: Diagnosis not present

## 2019-05-02 ENCOUNTER — Ambulatory Visit: Payer: BC Managed Care – PPO | Attending: Internal Medicine

## 2019-05-02 DIAGNOSIS — Z20822 Contact with and (suspected) exposure to covid-19: Secondary | ICD-10-CM

## 2019-05-03 ENCOUNTER — Encounter: Payer: Self-pay | Admitting: *Deleted

## 2019-05-03 LAB — NOVEL CORONAVIRUS, NAA: SARS-CoV-2, NAA: DETECTED — AB

## 2019-05-11 ENCOUNTER — Other Ambulatory Visit: Payer: BC Managed Care – PPO

## 2019-08-10 ENCOUNTER — Ambulatory Visit: Payer: BC Managed Care – PPO | Attending: Internal Medicine

## 2019-08-10 DIAGNOSIS — Z23 Encounter for immunization: Secondary | ICD-10-CM

## 2019-08-10 NOTE — Progress Notes (Signed)
   Covid-19 Vaccination Clinic  Name:  JURELL BASISTA    MRN: [id number] DOB: 1970-09-26  08/10/2019  Mr. Spellman was observed post Covid-19 immunization for 15 minutes without incident. He was provided with Vaccine Information Sheet and instruction to access the V-Safe system.   Mr. Shatzer was instructed to call 911 with any severe reactions post vaccine: Marland Kitchen Difficulty breathing  . Swelling of face and throat  . A fast heartbeat  . A bad rash all over body  . Dizziness and weakness   Immunizations Administered    Name Date Dose VIS Date Route   Pfizer COVID-19 Vaccine 08/10/2019  3:36 PM 0.3 mL 04/13/2019 Intramuscular   Manufacturer: [employer], [employer]   Lot: [id number]   NDC: [address]-5

## 2019-09-03 ENCOUNTER — Ambulatory Visit: Payer: BC Managed Care – PPO | Attending: Internal Medicine

## 2019-09-03 DIAGNOSIS — Z23 Encounter for immunization: Secondary | ICD-10-CM

## 2019-09-03 NOTE — Progress Notes (Signed)
   Covid-19 Vaccination Clinic  Name:  SKYE RODARTE    MRN: [id number] DOB: January 12, 1971  09/03/2019  Mr. Silvers was observed post Covid-19 immunization for 15 minutes without incident. He was provided with Vaccine Information Sheet and instruction to access the V-Safe system.   Mr. Tat was instructed to call 911 with any severe reactions post vaccine: Marland Kitchen Difficulty breathing  . Swelling of face and throat  . A fast heartbeat  . A bad rash all over body  . Dizziness and weakness   Immunizations Administered    Name Date Dose VIS Date Route   Pfizer COVID-19 Vaccine 09/03/2019  4:42 PM 0.3 mL 06/27/2018 Intramuscular   Manufacturer: [employer], [employer]   Lot: [id number]   NDC: [address]-3

## 2019-10-03 DIAGNOSIS — R350 Frequency of micturition: Secondary | ICD-10-CM | POA: Diagnosis not present

## 2019-10-03 DIAGNOSIS — E7800 Pure hypercholesterolemia, unspecified: Secondary | ICD-10-CM | POA: Diagnosis not present

## 2019-10-03 DIAGNOSIS — J301 Allergic rhinitis due to pollen: Secondary | ICD-10-CM | POA: Diagnosis not present

## 2019-10-03 DIAGNOSIS — R7309 Other abnormal glucose: Secondary | ICD-10-CM | POA: Diagnosis not present

## 2019-10-03 DIAGNOSIS — Z0000 Encounter for general adult medical examination without abnormal findings: Secondary | ICD-10-CM | POA: Diagnosis not present

## 2019-10-03 DIAGNOSIS — I10 Essential (primary) hypertension: Secondary | ICD-10-CM | POA: Diagnosis not present

## 2019-10-22 DIAGNOSIS — J069 Acute upper respiratory infection, unspecified: Secondary | ICD-10-CM | POA: Diagnosis not present

## 2020-04-08 DIAGNOSIS — R7309 Other abnormal glucose: Secondary | ICD-10-CM | POA: Diagnosis not present

## 2020-04-08 DIAGNOSIS — E7800 Pure hypercholesterolemia, unspecified: Secondary | ICD-10-CM | POA: Diagnosis not present

## 2020-04-08 DIAGNOSIS — I10 Essential (primary) hypertension: Secondary | ICD-10-CM | POA: Diagnosis not present

## 2020-04-18 ENCOUNTER — Ambulatory Visit: Payer: BC Managed Care – PPO

## 2020-04-18 ENCOUNTER — Ambulatory Visit: Payer: BC Managed Care – PPO | Attending: Internal Medicine

## 2020-04-18 DIAGNOSIS — Z23 Encounter for immunization: Secondary | ICD-10-CM

## 2020-04-18 NOTE — Progress Notes (Signed)
   Covid-19 Vaccination Clinic  Name:  DAJION BICKFORD    MRN: [id number] DOB: [birth year]  04/18/2020  Mr. Gasparro was observed post Covid-19 immunization for 15 minutes without incident. He was provided with Vaccine Information Sheet and instruction to access the V-Safe system.   Mr. Breach was instructed to call 911 with any severe reactions post vaccine: Marland Kitchen Difficulty breathing  . Swelling of face and throat  . A fast heartbeat  . A bad rash all over body  . Dizziness and weakness   Immunizations Administered    Name Date Dose VIS Date Route   Pfizer COVID-19 Vaccine 04/18/2020  5:32 PM 0.3 mL 02/20/2020 Intramuscular   Manufacturer: [employer], [employer]   Lot: [id number]   NDC: [address]-9

## 2020-07-03 IMAGING — US US ABDOMEN LIMITED
1 series · 14 of 25 positions shown · non-contrast
Comparison: None.

CLINICAL DATA: Elevated LFTs

EXAM:
ULTRASOUND ABDOMEN LIMITED RIGHT UPPER QUADRANT

[Series 1: us abdomen limited · 0.20mm/px · 14 of 38 slices shown]
[im 1/38]
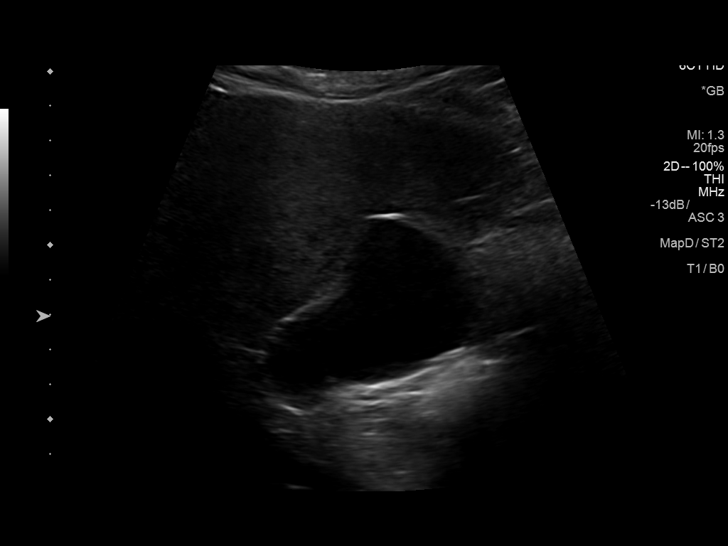
[im 4/38]
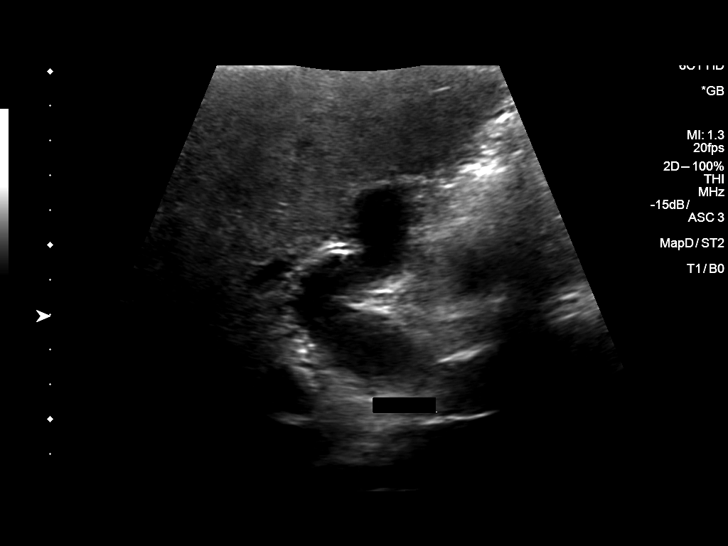
[im 7/38]
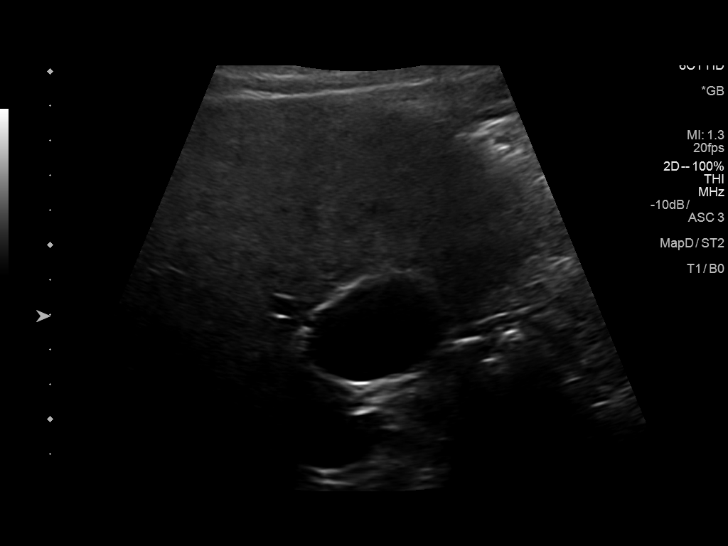
[im 10/38]
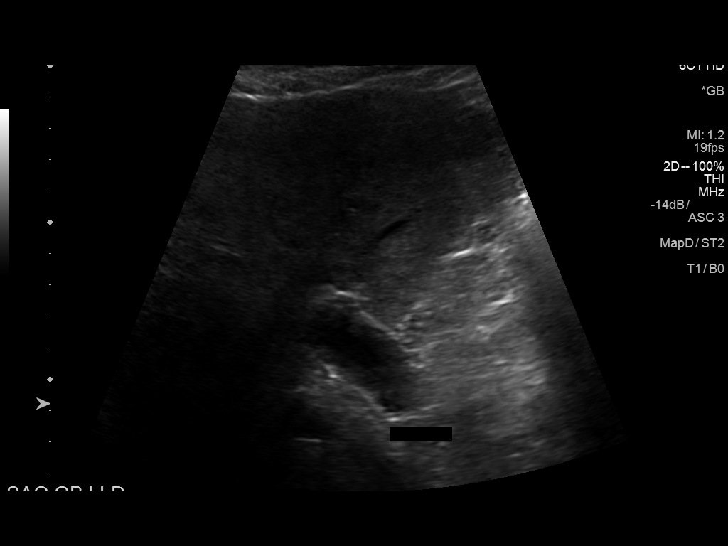
[im 13/38]
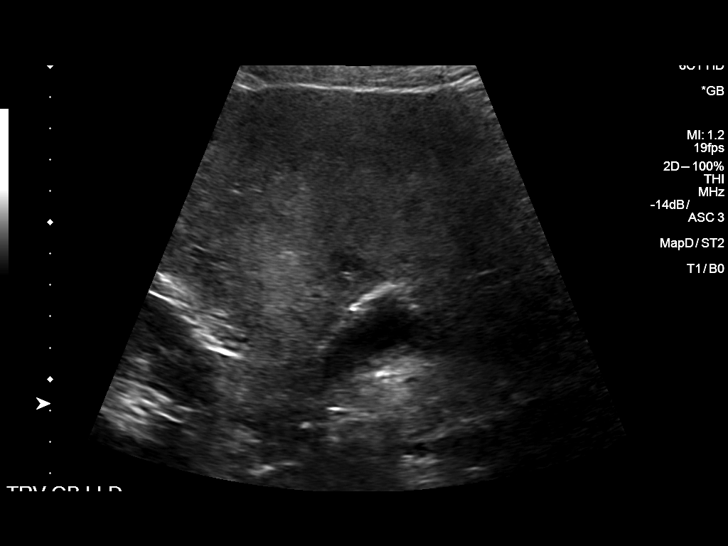
[im 14/38]
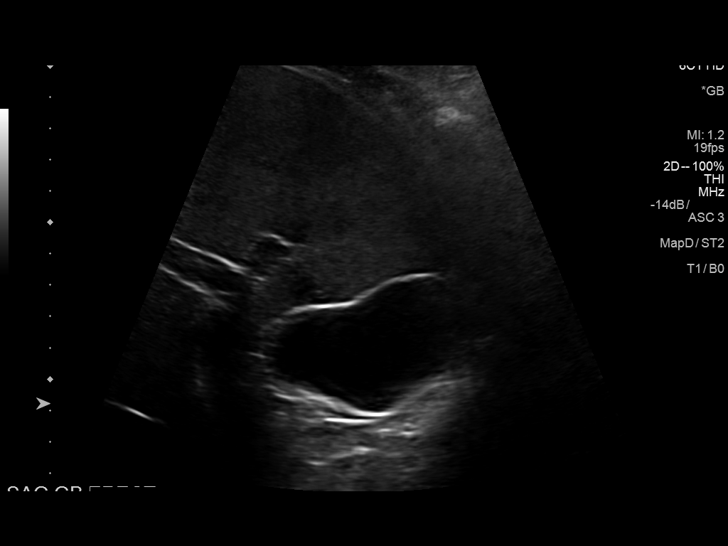
[im 17/38]
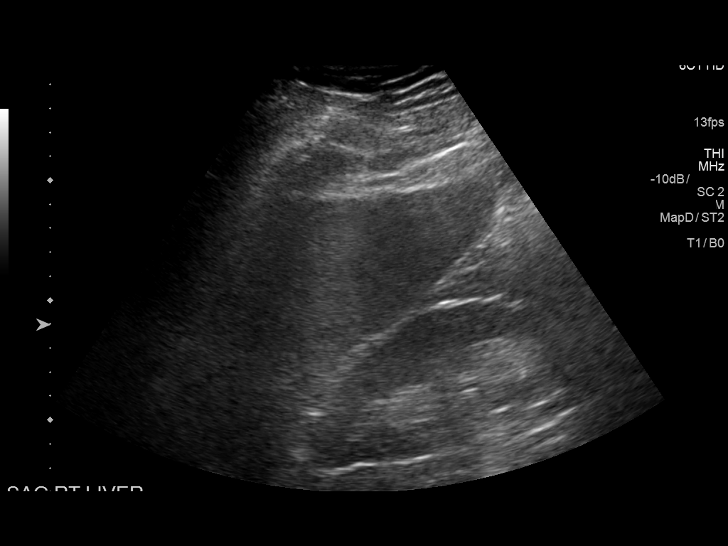
[im 21/38]
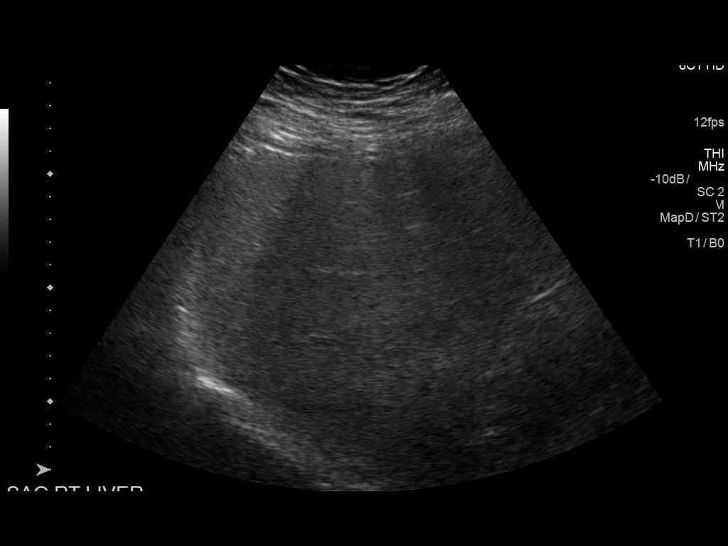
[im 24/38]
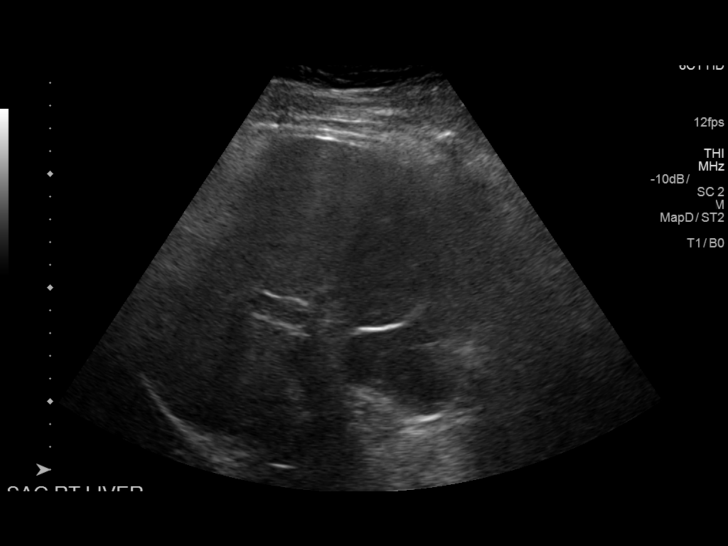
[im 25/38]
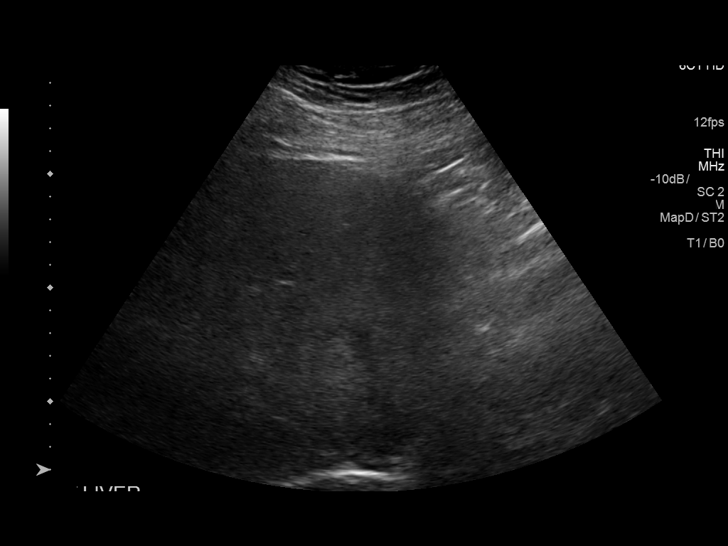
[im 28/38]
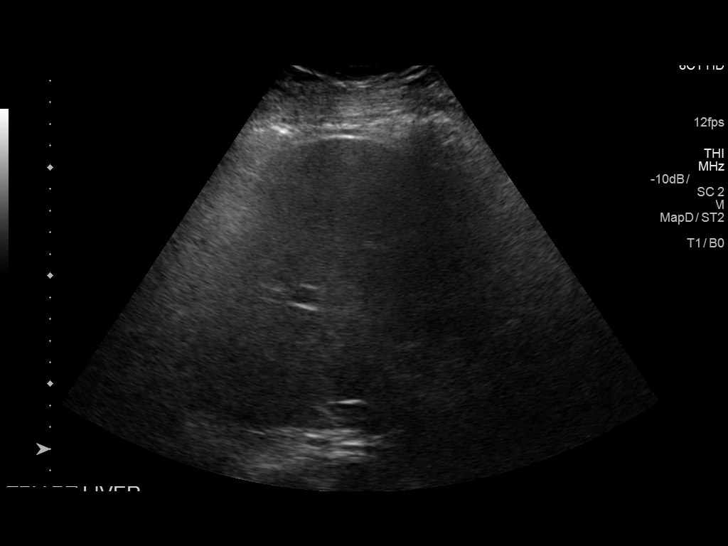
[im 31/38]
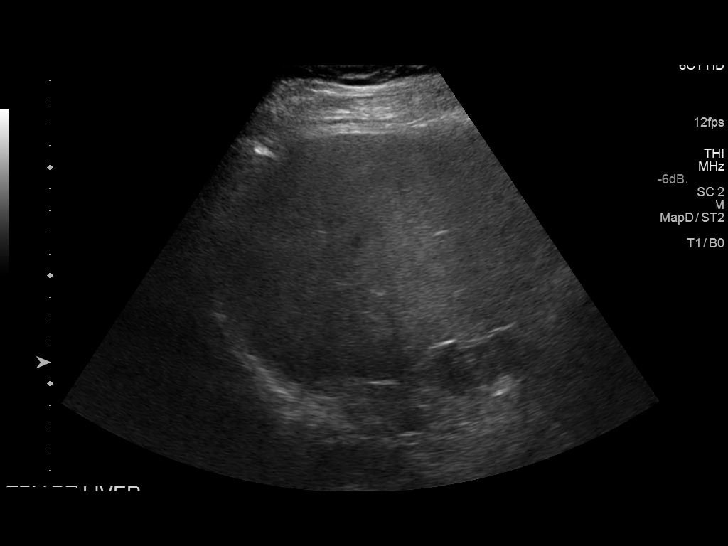
[im 34/38]
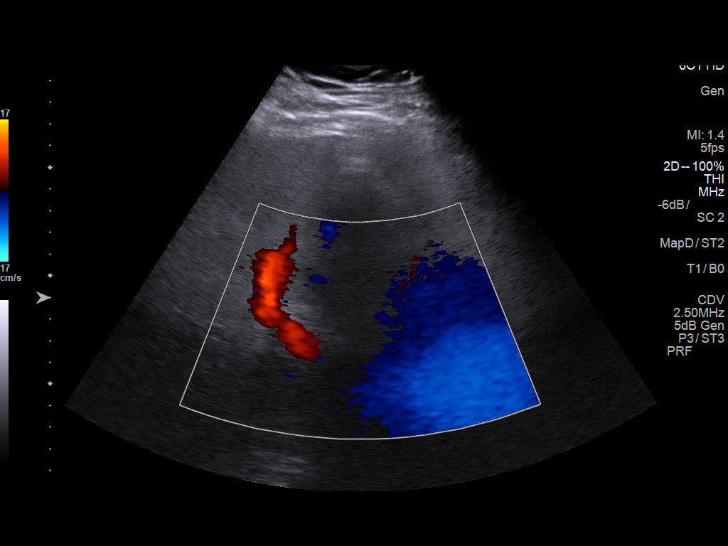
[im 38/38]
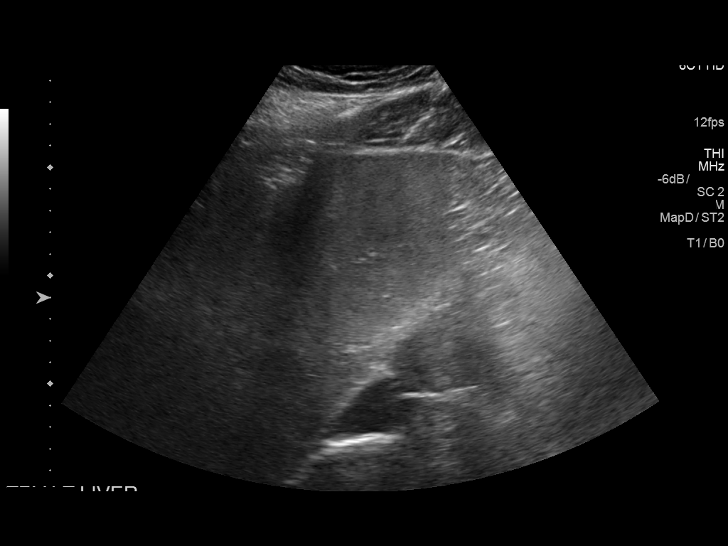

[14 of 25 positions shown; findings below may reference images not displayed]

FINDINGS: Gallbladder:

No gallstones or wall thickening visualized. No sonographic Murphy
sign noted by sonographer.

Common bile duct:

Diameter: 3.4 mm

Liver:

Slight increased echogenicity and heterogeneity suspicious for mild
hepatic steatosis. No focal hepatic abnormality or intrahepatic
biliary dilatation. No surrounding ascites.

Portal vein is patent on color Doppler imaging with normal direction
of blood flow towards the liver.
IMPRESSION: Negative for gallstones.

Suspect mild hepatic steatosis.

No acute finding by ultrasound

## 2020-07-21 DIAGNOSIS — S92911A Unspecified fracture of right toe(s), initial encounter for closed fracture: Secondary | ICD-10-CM | POA: Diagnosis not present

## 2020-07-21 DIAGNOSIS — S99921A Unspecified injury of right foot, initial encounter: Secondary | ICD-10-CM | POA: Diagnosis not present

## 2020-07-21 DIAGNOSIS — W208XXA Other cause of strike by thrown, projected or falling object, initial encounter: Secondary | ICD-10-CM | POA: Diagnosis not present

## 2020-07-21 DIAGNOSIS — S92334S Nondisplaced fracture of third metatarsal bone, right foot, sequela: Secondary | ICD-10-CM | POA: Diagnosis not present

## 2020-07-29 DIAGNOSIS — S92534A Nondisplaced fracture of distal phalanx of right lesser toe(s), initial encounter for closed fracture: Secondary | ICD-10-CM | POA: Diagnosis not present

## 2020-08-19 DIAGNOSIS — S92534D Nondisplaced fracture of distal phalanx of right lesser toe(s), subsequent encounter for fracture with routine healing: Secondary | ICD-10-CM | POA: Diagnosis not present

## 2020-10-03 DIAGNOSIS — H6123 Impacted cerumen, bilateral: Secondary | ICD-10-CM | POA: Diagnosis not present

## 2020-10-27 DIAGNOSIS — E7800 Pure hypercholesterolemia, unspecified: Secondary | ICD-10-CM | POA: Diagnosis not present

## 2020-10-27 DIAGNOSIS — I10 Essential (primary) hypertension: Secondary | ICD-10-CM | POA: Diagnosis not present

## 2020-10-27 DIAGNOSIS — Z125 Encounter for screening for malignant neoplasm of prostate: Secondary | ICD-10-CM | POA: Diagnosis not present

## 2020-10-27 DIAGNOSIS — R7309 Other abnormal glucose: Secondary | ICD-10-CM | POA: Diagnosis not present

## 2020-10-27 DIAGNOSIS — Z0000 Encounter for general adult medical examination without abnormal findings: Secondary | ICD-10-CM | POA: Diagnosis not present

## 2021-01-15 DIAGNOSIS — Z0289 Encounter for other administrative examinations: Secondary | ICD-10-CM

## 2021-01-18 DIAGNOSIS — M7918 Myalgia, other site: Secondary | ICD-10-CM | POA: Diagnosis not present

## 2021-01-18 DIAGNOSIS — R109 Unspecified abdominal pain: Secondary | ICD-10-CM | POA: Diagnosis not present

## 2021-01-20 DIAGNOSIS — B029 Zoster without complications: Secondary | ICD-10-CM | POA: Diagnosis not present

## 2021-01-21 ENCOUNTER — Ambulatory Visit (INDEPENDENT_AMBULATORY_CARE_PROVIDER_SITE_OTHER): Payer: BC Managed Care – PPO | Admitting: Family Medicine

## 2021-01-21 ENCOUNTER — Other Ambulatory Visit: Payer: Self-pay

## 2021-01-21 ENCOUNTER — Encounter (INDEPENDENT_AMBULATORY_CARE_PROVIDER_SITE_OTHER): Payer: Self-pay | Admitting: Family Medicine

## 2021-01-21 VITALS — BP 149/80 | HR 96 | Temp 98.3°F | Ht 73.0 in | Wt 349.0 lb

## 2021-01-21 DIAGNOSIS — R5383 Other fatigue: Secondary | ICD-10-CM

## 2021-01-21 DIAGNOSIS — K760 Fatty (change of) liver, not elsewhere classified: Secondary | ICD-10-CM

## 2021-01-21 DIAGNOSIS — Z9189 Other specified personal risk factors, not elsewhere classified: Secondary | ICD-10-CM

## 2021-01-21 DIAGNOSIS — R0602 Shortness of breath: Secondary | ICD-10-CM

## 2021-01-21 DIAGNOSIS — Z6841 Body Mass Index (BMI) 40.0 and over, adult: Secondary | ICD-10-CM

## 2021-01-21 DIAGNOSIS — Z1331 Encounter for screening for depression: Secondary | ICD-10-CM | POA: Diagnosis not present

## 2021-01-21 DIAGNOSIS — E7849 Other hyperlipidemia: Secondary | ICD-10-CM

## 2021-01-21 DIAGNOSIS — I10 Essential (primary) hypertension: Secondary | ICD-10-CM

## 2021-01-21 NOTE — Progress Notes (Signed)
Dear Dr. Docia Chuck,   Thank you for referring Kevin [name] to our clinic. The following note includes my evaluation and treatment recommendations.  Chief Complaint:   OBESITY Kevin [name] (MR# [id number]) is a 50 y.o. male who presents for evaluation and treatment of obesity and related comorbidities. Current BMI is Body mass index is 46.04 kg/m. Kevin [name] has been struggling with his weight for many years and has been unsuccessful in either losing weight, maintaining weight loss, or reaching his healthy weight goal.  Kevin [name] is currently in the action stage of change and ready to dedicate time achieving and maintaining a healthier weight. Kevin [name] is interested in becoming our patient and working on intensive lifestyle modifications including (but not limited to) diet and exercise for weight loss.  Referred by Dr. Docia Chuck. 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 He skips breakfast 3 times a week (due to hunger). Coffee in the morning; maybe bagel with cream cheese or plain or small granola bar or crackers; Lunch 12-1 PM- BBQ pork + hushpuppies + coleslaw (full) or wrap (buffalo chicken) + chips and drink (satisfied); 7 PM- chicken pie (vegetable, gravy, bread on top) or chili; Normally not hungry after dinner.  Kevin [name]'s habits were reviewed today and are as follows: His family eats meals together, he thinks his family will eat healthier with him, his desired weight loss is 124 lbs, he started gaining weight around age 6, his heaviest weight ever was 353 pounds, he snacks frequently in the evenings, he skips meals frequently, he is frequently drinking liquids with calories, he frequently makes poor food choices, he frequently eats larger portions than normal, he has binge eating behaviors, and he struggles with emotional eating.  Depression Screen Kevin [name]'s Food and Mood (modified PHQ-9) score was 9.  Depression screen PHQ 2/9 01/21/2021  Decreased Interest 1  Down,  Depressed, Hopeless 1  PHQ - 2 Score 2  Altered sleeping 3  Tired, decreased energy 3  Change in appetite 0  Feeling bad or failure about yourself  0  Trouble concentrating 1  Moving slowly or fidgety/restless 0  Suicidal thoughts 0  PHQ-9 Score 9  Difficult doing work/chores Not difficult at all   Subjective:   1. Other fatigue Kevin [name] admits to daytime somnolence and admits to waking up still tired. Patent has a history of symptoms of daytime fatigue and morning fatigue. Kevin [name] generally gets 6 or 7 hours of sleep per night, and states that he has poor sleep quality. Snoring is present. Apneic episodes are present. Epworth Sleepiness Score is 13. EKG- RBBB.  2. SOB (shortness of breath) Kevin [name] notes increasing shortness of breath with exercising and seems to be worsening over time with weight gain. He notes getting out of breath sooner with activity than he used to. This has gotten worse recently. Kevin [name] denies shortness of breath at rest or orthopnea.  3. Essential hypertension Diagnosed 10 years ago. BP slightly elevated today. Kevin [name] is on lisinopril-HCTZ.  4. Other hyperlipidemia Diagnosed years ago. Pt is on statin therapy.  5. NAFLD (nonalcoholic fatty liver disease) Ultrasound in 2019 showing steatosis.  6. At risk for obstructive sleep apnea Kevin [name] is at increased risk for sleep apnea due to obesity. The patient endorses the following symptoms: snoring, apneic events, and daytime sleepiness. Epworth score: 13.   Assessment/Plan:   1. Other fatigue Kevin [name] does feel that his weight is causing his energy to be lower than it should be. Fatigue may be related  to obesity, depression or many other causes. Labs will be ordered, and in the meanwhile, Kevin [name] will focus on self care including making healthy food choices, increasing physical activity and focusing on stress reduction. Refer to cardiology. Check labs today.  - EKG 12-Lead - Vitamin B12 - Folate -  Hemoglobin A1c - Insulin, random - T3 - T4, free - TSH - VITAMIN D 25 Hydroxy (Vit-D Deficiency, Fractures) - Ambulatory referral to Cardiology  2. SOB (shortness of breath) Kevin [name] does feel that he gets out of breath more easily that he used to when he exercises. Kevin [name]'s shortness of breath appears to be obesity related and exercise induced. He has agreed to work on weight loss and gradually increase exercise to treat his exercise induced shortness of breath. Will continue to monitor closely. Check labs today. Refer to Neurology for sleep study.  - CBC with Differential/Platelet - Ambulatory referral to Cardiology - Ambulatory referral to Neurology  3. Essential hypertension Kevin [name] is working on healthy weight loss and exercise to improve blood pressure control. We will watch for signs of hypotension as he continues his lifestyle modifications. Check labs today.  - Comprehensive metabolic panel - Ambulatory referral to Cardiology  4. Other hyperlipidemia Cardiovascular risk and specific lipid/LDL goals reviewed.  We discussed several lifestyle modifications today and Kevin [name] will continue to work on diet, exercise and weight loss efforts. Orders and follow up as documented in patient record.   Counseling Intensive lifestyle modifications are the first line treatment for this issue. Dietary changes: Increase soluble fiber. Decrease simple carbohydrates. Exercise changes: Moderate to vigorous-intensity aerobic activity 150 minutes per week if tolerated. Lipid-lowering medications: see documented in medical record. Check labs today.  - Lipid Panel With LDL/HDL Ratio - Ambulatory referral to Cardiology  5. NAFLD (nonalcoholic fatty liver disease) We discussed the likely diagnosis of non-alcoholic fatty liver disease today and how this condition is obesity related. Kevin [name] was educated the importance of weight loss. [name] agreed to continue with his weight loss efforts with  healthier diet and exercise as an essential part of his treatment plan. Check labs today.  - Comprehensive metabolic panel  6. Depression screening Kevin [name] had a positive depression screening. Depression is commonly associated with obesity and often results in emotional eating behaviors. We will monitor this closely and work on CBT to help improve the non-hunger eating patterns. Referral to Psychology may be required if no improvement is seen as he continues in our clinic.  7. At risk for obstructive sleep apnea Amontae was given approximately 15 minutes of coronary artery disease prevention counseling today. He is 50 y.o. male and has risk factors for obstructive sleep apnea including obesity. We discussed intensive lifestyle modifications today with an emphasis on specific weight loss instructions and strategies.  Repetitive spaced learning was employed today to elicit superior memory formation and behavioral change.  8. Obesity with current BMI of 46.1  Damico is currently in the action stage of change and his goal is to continue with weight loss efforts. I recommend Honest begin the structured treatment plan as follows:  He has agreed to the Category 4 Plan.  Exercise goals: No exercise has been prescribed at this time.   Behavioral modification strategies: increasing lean protein intake, meal planning and cooking strategies, keeping healthy foods in the home, and planning for success.  He was informed of the importance of frequent follow-up visits to maximize his success with intensive lifestyle modifications for his multiple health conditions. He was informed we would  discuss his lab results at his next visit unless there is a critical issue that needs to be addressed sooner. Patryck agreed to keep his next visit at the agreed upon time to discuss these results.  Objective:   Blood pressure (!) 149/80, pulse 96, temperature 98.3 F (36.8 C), height 6\' 1"  (1.854 m), weight (!) 349  lb (158.3 kg), SpO2 96 %. Body mass index is 46.04 kg/m.  EKG: Abnormal- sinus rhythm, rate 94- RBBB.  Indirect Calorimeter completed today shows a VO2 of 423 and a REE of 2923.  His calculated basal metabolic rate is 2924 thus his basal metabolic rate is worse than expected.  General: Cooperative, alert, well developed, in no acute distress. HEENT: Conjunctivae and lids unremarkable. Cardiovascular: Regular rhythm.  Lungs: Normal work of breathing. Neurologic: No focal deficits.   No results found for: CREATININE, BUN, NA, K, CL, CO2 No results found for: ALT, AST, GGT, ALKPHOS, BILITOT No results found for: HGBA1C No results found for: INSULIN No results found for: TSH No results found for: CHOL, HDL, LDLCALC, LDLDIRECT, TRIG, CHOLHDL No results found for: WBC, HGB, HCT, MCV, PLT No results found for: IRON, TIBC, FERRITIN   Attestation Statements:   Reviewed by clinician on day of visit: allergies, medications, problem list, medical history, surgical history, family history, social history, and previous encounter notes.  [id number], CMA, am acting as transcriptionist for Edmund Hilda, MD.  This is the patient's first visit at [facility]. The patient's NEW PATIENT PACKET was reviewed at length. Included in the packet: current and past health history, medications, allergies, ROS, gynecologic history (women only), surgical history, family history, social history, weight history, weight loss surgery history (for those that have had weight loss surgery), nutritional evaluation, mood and food questionnaire, PHQ9, Epworth questionnaire, sleep habits questionnaire, patient life and health improvement goals questionnaire. These will all be scanned into the patient's chart under media.   During the visit, I independently reviewed the patient's EKG, bioimpedance scale results, and indirect calorimeter results. I used this information to tailor a meal plan for the  patient that will help him to lose weight and will improve his obesity-related conditions going forward. I performed a medically necessary appropriate examination and/or evaluation. I discussed the assessment and treatment plan with the patient. The patient was provided an opportunity to ask questions and all were answered. The patient agreed with the plan and demonstrated an understanding of the instructions. Labs were ordered at this visit and will be reviewed at the next visit unless more critical results need to be addressed immediately. Clinical information was updated and documented in the EMR.   Time spent on visit including pre-visit chart review and post-visit care was 45 minutes.   A separate 15 minutes was spent on risk counseling (see above).  I have reviewed the above documentation for accuracy and completeness, and I agree with the above. - Reuben Likes, MD

## 2021-01-22 LAB — HEMOGLOBIN A1C
Est. average glucose Bld gHb Est-mCnc: 131 mg/dL
Hgb A1c MFr Bld: 6.2 % — ABNORMAL HIGH (ref 4.8–5.6)

## 2021-01-22 LAB — COMPREHENSIVE METABOLIC PANEL
ALT: 39 IU/L (ref 0–44)
AST: 30 IU/L (ref 0–40)
Albumin/Globulin Ratio: 1.8 (ref 1.2–2.2)
Albumin: 4.6 g/dL (ref 4.0–5.0)
Alkaline Phosphatase: 104 IU/L (ref 44–121)
BUN/Creatinine Ratio: 17 (ref 9–20)
BUN: 17 mg/dL (ref 6–24)
Bilirubin Total: 0.4 mg/dL (ref 0.0–1.2)
CO2: 22 mmol/L (ref 20–29)
Calcium: 9.5 mg/dL (ref 8.7–10.2)
Chloride: 102 mmol/L (ref 96–106)
Creatinine, Ser: 1.01 mg/dL (ref 0.76–1.27)
Globulin, Total: 2.5 g/dL (ref 1.5–4.5)
Glucose: 111 mg/dL — ABNORMAL HIGH (ref 65–99)
Potassium: 4.5 mmol/L (ref 3.5–5.2)
Sodium: 141 mmol/L (ref 134–144)
Total Protein: 7.1 g/dL (ref 6.0–8.5)
eGFR: 92 mL/min/{1.73_m2} (ref >59)

## 2021-01-22 LAB — CBC WITH DIFFERENTIAL/PLATELET
Basophils Absolute: 0.1 10*3/uL (ref 0.0–0.2)
Basos: 1 %
EOS (ABSOLUTE): 0.2 10*3/uL (ref 0.0–0.4)
Eos: 5 %
Hematocrit: 44.5 % (ref 37.5–51.0)
Hemoglobin: 14.9 g/dL (ref 13.0–17.7)
Immature Grans (Abs): 0 10*3/uL (ref 0.0–0.1)
Immature Granulocytes: 0 %
Lymphocytes Absolute: 1.1 10*3/uL (ref 0.7–3.1)
Lymphs: 23 %
MCH: 30.3 pg (ref 26.6–33.0)
MCHC: 33.5 g/dL (ref 31.5–35.7)
MCV: 91 fL (ref 79–97)
Monocytes Absolute: 0.8 10*3/uL (ref 0.1–0.9)
Monocytes: 17 %
Neutrophils Absolute: 2.6 10*3/uL (ref 1.4–7.0)
Neutrophils: 54 %
Platelets: 239 10*3/uL (ref 150–450)
RBC: 4.91 x10E6/uL (ref 4.14–5.80)
RDW: 12.9 % (ref 11.6–15.4)
WBC: 4.8 10*3/uL (ref 3.4–10.8)

## 2021-01-22 LAB — T3: T3, Total: 146 ng/dL (ref 71–180)

## 2021-01-22 LAB — LIPID PANEL WITH LDL/HDL RATIO
Cholesterol, Total: 148 mg/dL (ref 100–199)
HDL: 49 mg/dL (ref >39)
LDL Chol Calc (NIH): 79 mg/dL (ref 0–99)
LDL/HDL Ratio: 1.6 ratio (ref 0.0–3.6)
Triglycerides: 110 mg/dL (ref 0–149)
VLDL Cholesterol Cal: 20 mg/dL (ref 5–40)

## 2021-01-22 LAB — INSULIN, RANDOM: INSULIN: 30.7 u[IU]/mL — ABNORMAL HIGH (ref 2.6–24.9)

## 2021-01-22 LAB — VITAMIN D 25 HYDROXY (VIT D DEFICIENCY, FRACTURES): Vit D, 25-Hydroxy: 26.4 ng/mL — ABNORMAL LOW (ref 30.0–100.0)

## 2021-01-22 LAB — VITAMIN B12: Vitamin B-12: 399 pg/mL (ref 232–1245)

## 2021-01-22 LAB — TSH: TSH: 2.65 u[IU]/mL (ref 0.450–4.500)

## 2021-01-22 LAB — T4, FREE: Free T4: 1.11 ng/dL (ref 0.82–1.77)

## 2021-01-22 LAB — FOLATE: Folate: 8.9 ng/mL (ref >3.0)

## 2021-02-04 ENCOUNTER — Ambulatory Visit (INDEPENDENT_AMBULATORY_CARE_PROVIDER_SITE_OTHER): Payer: Self-pay | Admitting: Family Medicine

## 2021-02-19 ENCOUNTER — Ambulatory Visit (INDEPENDENT_AMBULATORY_CARE_PROVIDER_SITE_OTHER): Payer: BC Managed Care – PPO | Admitting: Bariatrics

## 2021-02-19 ENCOUNTER — Encounter (INDEPENDENT_AMBULATORY_CARE_PROVIDER_SITE_OTHER): Payer: Self-pay | Admitting: Bariatrics

## 2021-02-19 ENCOUNTER — Other Ambulatory Visit: Payer: Self-pay

## 2021-02-19 VITALS — BP 112/74 | HR 111 | Temp 97.8°F | Ht 73.0 in | Wt 341.0 lb

## 2021-02-19 DIAGNOSIS — R7303 Prediabetes: Secondary | ICD-10-CM | POA: Diagnosis not present

## 2021-02-19 DIAGNOSIS — E559 Vitamin D deficiency, unspecified: Secondary | ICD-10-CM

## 2021-02-19 DIAGNOSIS — Z6841 Body Mass Index (BMI) 40.0 and over, adult: Secondary | ICD-10-CM

## 2021-02-19 MED ORDER — VITAMIN D (ERGOCALCIFEROL) 1.25 MG (50000 UNIT) PO CAPS: 50000.0000 [IU] | ORAL_CAPSULE | ORAL | 0 refills | Status: DC

## 2021-02-19 NOTE — Progress Notes (Signed)
Chief Complaint:   OBESITY Kevin [name] is here to discuss his progress with his obesity treatment plan along with follow-up of his obesity related diagnoses. Kevin [name] is on the Category 4 Plan and states he is following his eating plan approximately 50% of the time. Kevin [name] states he is doing 0 minutes 0 times per week.  Today's visit was #: 3 Starting weight: 349 lbs Starting date: 01/21/2021 Today's weight: 341 lbs Today's date: 02/19/2021 Total lbs lost to date: 8 lbs Total lbs lost since last in-office visit: 8 lbs  Interim History: Kevin [name] is down 8 lbs since his initial visit. He states that he had shingles and was not able to follow the plan exactly.   Subjective:   1. Prediabetes Kevin [name] is currently not on medications. His last A1C level was 6.2.  2. Vitamin D insufficiency Kevin [name] is currently not on Vitamin D.   Assessment/Plan:   1. Prediabetes Kevin [name] will continue to work on weight loss, exercise, and decreasing simple carbohydrates to help decrease the risk of diabetes. Handouts on Insulin resistance and pre-diabetes was given out today.  2. Vitamin D insufficiency Low Vitamin D level contributes to fatigue and are associated with obesity, breast, and colon cancer. We will refil prescription Vitamin D 50,000 IU every week for 1 month with no refills and Kevin [name] will follow-up for routine testing of Vitamin D, at least 2-3 times per year to avoid over-replacement.  - Vitamin D, Ergocalciferol, (DRISDOL) 1.25 MG (50000 UNIT) CAPS capsule; Take 1 capsule (50,000 Units total) by mouth every 7 (seven) days.  Dispense: 4 capsule; Refill: 0  3. Obesity with current BMI of 45.1 Kevin [name] is currently in the action stage of change. As such, his goal is to continue with weight loss efforts. He has agreed to the Category 4 Plan and keeping a food journal and adhering to recommended goals of 1800 calories and 100-120 grams of protein.   Kevin [name] will continue meal planning. He  will adhere closely to the plan. We reviewed labs from 01/21/2021.  Exercise goals: No exercise has been prescribed at this time.  Behavioral modification strategies: increasing lean protein intake, decreasing simple carbohydrates, increasing vegetables, increasing water intake, decreasing eating out, no skipping meals, meal planning and cooking strategies, keeping healthy foods in the home, and planning for success.  [name] has agreed to follow-up with our clinic in 2 weeks with Dr. Lawson Radar or William Hamburger, NP.  He was informed of the importance of frequent follow-up visits to maximize his success with intensive lifestyle modifications for his multiple health conditions.   Objective:   Blood pressure 112/74, pulse (!) 111, temperature 97.8 F (36.6 C), height 6\' 1"  (1.854 m), weight (!) 341 lb (154.7 kg), SpO2 97 %. Body mass index is 44.99 kg/m.  General: Cooperative, alert, well developed, in no acute distress. HEENT: Conjunctivae and lids unremarkable. Cardiovascular: Regular rhythm.  Lungs: Normal work of breathing. Neurologic: No focal deficits.   Lab Results  Component Value Date   CREATININE 1.01 01/21/2021   BUN 17 01/21/2021   NA 141 01/21/2021   K 4.5 01/21/2021   CL 102 01/21/2021   CO2 22 01/21/2021   Lab Results  Component Value Date   ALT 39 01/21/2021   AST 30 01/21/2021   ALKPHOS 104 01/21/2021   BILITOT 0.4 01/21/2021   Lab Results  Component Value Date   HGBA1C 6.2 (H) 01/21/2021   Lab Results  Component Value Date   INSULIN 30.7 (H) 01/21/2021  Lab Results  Component Value Date   TSH 2.650 01/21/2021   Lab Results  Component Value Date   CHOL 148 01/21/2021   HDL 49 01/21/2021   LDLCALC 79 01/21/2021   TRIG 110 01/21/2021   Lab Results  Component Value Date   VD25OH 26.4 (L) 01/21/2021   Lab Results  Component Value Date   WBC 4.8 01/21/2021   HGB 14.9 01/21/2021   HCT 44.5 01/21/2021   MCV 91 01/21/2021   PLT 239 01/21/2021   No  results found for: IRON, TIBC, FERRITIN  Attestation Statements:   Reviewed by clinician on day of visit: allergies, medications, problem list, medical history, surgical history, family history, social history, and previous encounter notes.  I, Jackson Latino, RMA, am acting as Energy manager for [employer], DO.   I have reviewed the above documentation for accuracy and completeness, and I agree with the above. Corinna Capra, DO

## 2021-02-23 ENCOUNTER — Encounter (INDEPENDENT_AMBULATORY_CARE_PROVIDER_SITE_OTHER): Payer: Self-pay | Admitting: Bariatrics

## 2021-02-23 DIAGNOSIS — Z6841 Body Mass Index (BMI) 40.0 and over, adult: Secondary | ICD-10-CM | POA: Insufficient documentation

## 2021-03-05 ENCOUNTER — Encounter (INDEPENDENT_AMBULATORY_CARE_PROVIDER_SITE_OTHER): Payer: Self-pay | Admitting: Family Medicine

## 2021-03-05 ENCOUNTER — Ambulatory Visit (INDEPENDENT_AMBULATORY_CARE_PROVIDER_SITE_OTHER): Payer: BC Managed Care – PPO | Admitting: Family Medicine

## 2021-03-05 ENCOUNTER — Other Ambulatory Visit: Payer: Self-pay

## 2021-03-05 VITALS — BP 104/65 | HR 100 | Temp 98.5°F | Ht 73.0 in | Wt 335.0 lb

## 2021-03-05 DIAGNOSIS — E559 Vitamin D deficiency, unspecified: Secondary | ICD-10-CM

## 2021-03-05 DIAGNOSIS — I10 Essential (primary) hypertension: Secondary | ICD-10-CM | POA: Diagnosis not present

## 2021-03-05 DIAGNOSIS — R7303 Prediabetes: Secondary | ICD-10-CM | POA: Diagnosis not present

## 2021-03-05 DIAGNOSIS — Z6841 Body Mass Index (BMI) 40.0 and over, adult: Secondary | ICD-10-CM

## 2021-03-09 NOTE — Progress Notes (Signed)
Chief Complaint:   OBESITY Kevin [name] is here to discuss his progress with his obesity treatment plan along with follow-up of his obesity related diagnoses. Kevin [name] is on the Category 4 Plan and states he is following his eating plan approximately 75% of the time. Kevin [name] states he is hiking 3 miles 1 times per week.  Today's visit was #: 3 Starting weight: 349 lbs Starting date: 01/21/2021 Today's weight: 335 lbs Today's date: 03/05/2021 Total lbs lost to date: 14 Total lbs lost since last in-office visit: 6  Interim History: Kevin [name]'s hardest time of day is breakfast and getting food option in. He reports breakfast would be easier if he could heat something and get moving. Lunch has been pretty good (has cafeteria at work). Supper has been according to plan and getting all protein and vegetables in. Pt's snack calories are cheese sticks, popcorn, and fruit. He is not sure what he has planned for the next 2 weeks.  Subjective:   1. Pre-diabetes Kevin [name] A1c is 6.0 and insulin level 30.7 (A1c has been 6.1 for the last 3 checks).  2. Vitamin D deficiency Kevin [name] is on prescription Vit D and reports fatigue. His last Vit D level was 26.4.  3. Essential hypertension BP well controlled today. Pt denies dizziness.lightheadedness.  Assessment/Plan:   1. Pre-diabetes Kevin [name] will continue to work on weight loss, exercise, and decreasing simple carbohydrates to help decrease the risk of diabetes. Follow labs in January 2023.  2. Vitamin D deficiency Low Vitamin D level contributes to fatigue and are associated with obesity, breast, and colon cancer. He agrees to continue to take prescription Vitamin D 50,000 IU every week and will follow-up for routine testing of Vitamin D, at least 2-3 times per year to avoid over-replacement. No refill needed today.  3. Essential hypertension Kevin [name] is working on healthy weight loss and exercise to improve blood pressure control. We will watch for  signs of hypotension as he continues his lifestyle modifications. Follow up BP at next appt.  4. Obesity with current BMI of 44.3  Kevin [name] is currently in the action stage of change. As such, his goal is to continue with weight loss efforts. He has agreed to the Category 4 Plan.   Exercise goals: All adults should avoid inactivity. Some physical activity is better than none, and adults who participate in any amount of physical activity gain some health benefits.  Behavioral modification strategies: increasing lean protein intake, meal planning and cooking strategies, keeping healthy foods in the home, and planning for success.  Kevin [name] has agreed to follow-up with our clinic in 2 weeks. He was informed of the importance of frequent follow-up visits to maximize his success with intensive lifestyle modifications for his multiple health conditions.   Objective:   Blood pressure 104/65, pulse 100, temperature 98.5 F (36.9 C), height 6\' 1"  (1.854 m), weight (!) 335 lb (152 kg), SpO2 97 %. Body mass index is 44.2 kg/m.  General: Cooperative, alert, well developed, in no acute distress. HEENT: Conjunctivae and lids unremarkable. Cardiovascular: Regular rhythm.  Lungs: Normal work of breathing. Neurologic: No focal deficits.   Lab Results  Component Value Date   CREATININE 1.01 01/21/2021   BUN 17 01/21/2021   NA 141 01/21/2021   K 4.5 01/21/2021   CL 102 01/21/2021   CO2 22 01/21/2021   Lab Results  Component Value Date   ALT 39 01/21/2021   AST 30 01/21/2021   ALKPHOS 104 01/21/2021   BILITOT 0.4 01/21/2021  Lab Results  Component Value Date   HGBA1C 6.2 (H) 01/21/2021   Lab Results  Component Value Date   INSULIN 30.7 (H) 01/21/2021   Lab Results  Component Value Date   TSH 2.650 01/21/2021   Lab Results  Component Value Date   CHOL 148 01/21/2021   HDL 49 01/21/2021   LDLCALC 79 01/21/2021   TRIG 110 01/21/2021   Lab Results  Component Value Date   VD25OH  26.4 (L) 01/21/2021   Lab Results  Component Value Date   WBC 4.8 01/21/2021   HGB 14.9 01/21/2021   HCT 44.5 01/21/2021   MCV 91 01/21/2021   PLT 239 01/21/2021    Attestation Statements:   Reviewed by clinician on day of visit: allergies, medications, problem list, medical history, surgical history, family history, social history, and previous encounter notes.  Time spent on visit including pre-visit chart review and post-visit care and charting was 12 minutes.   Edmund Hilda, CMA, am acting as transcriptionist for Reuben Likes, MD.  I have reviewed the above documentation for accuracy and completeness, and I agree with the above. - Reuben Likes, MD

## 2021-03-10 DIAGNOSIS — Z789 Other specified health status: Secondary | ICD-10-CM | POA: Diagnosis not present

## 2021-03-10 DIAGNOSIS — L718 Other rosacea: Secondary | ICD-10-CM | POA: Diagnosis not present

## 2021-03-10 DIAGNOSIS — L918 Other hypertrophic disorders of the skin: Secondary | ICD-10-CM | POA: Diagnosis not present

## 2021-03-16 NOTE — Progress Notes (Signed)
Cardiology Office Note:    Date:  03/17/2021   ID:  Kevin [name], DOB 1970-09-11, MRN [id number]  PCP:  Darrow Bussing, MD  Cardiologist:  None  Electrophysiologist:  None   Referring MD: Langston Reusing, MD   Chief Complaint/Reason for Referral: Right bundle branch block and dyspnea  History of Present Illness:    Kevin [name] is a 50 y.o. male with the indicated history here for recommendations regarding an incidental RBBB seen in the context of dyspnea.  The patient was seen last month and the month before.  At that earlier visit he was reporting increasing dyspnea with weight gain.  An EKG demonstrated sinus rhythm with a right bundle branch block.  The patient tells me that he is about 100 pounds over his ideal body weight.  This has been the result of not exercising, having a sedentary job, and a left shoulder injury and so he does not play basketball like he did prior.  He notices shortness of breath when he exerts himself more more than moderately.  This has been going on for some time.  Denies any palpitations, paroxysmal nocturnal dyspnea, orthopnea, peripheral edema, or exertional chest pain.  He is required no emergency room visits or hospitalizations.  Past Medical History:  Diagnosis Date   Fatty liver    Hyperlipidemia    Hypertension   Morbid obesity, BMI>40 Prediabetes  Past Surgical History:  Procedure Laterality Date   ROTATOR CUFF REPAIR  2017    Current Medications: Current Meds  Medication Sig   ATORVASTATIN CALCIUM PO Take 20 mg by mouth.   fluticasone (FLONASE) 50 MCG/ACT nasal spray Place into both nostrils daily.   levocetirizine (XYZAL) 5 MG tablet Take 5 mg by mouth every evening.   lisinopril-hydrochlorothiazide (ZESTORETIC) 10-12.5 MG tablet Take 1 tablet by mouth daily.   Vitamin D, Ergocalciferol, (DRISDOL) 1.25 MG (50000 UNIT) CAPS capsule Take 1 capsule (50,000 Units total) by mouth every 7 (seven) days.     Allergies:   No  Known Allergies  Social History   Tobacco Use   Smoking status: Never   Smokeless tobacco: Never  Substance Use Topics   Alcohol use: No   Drug use: No     Family History: Family History  Problem Relation Age of Onset   Stroke Father    Hyperlipidemia Father    Hypertension Father    Heart disease Father    Obesity Father      ROS:   Please see the history of present illness.    All other systems reviewed and are negative.  EKGs/Labs/Other Studies Reviewed:    The following studies were reviewed today:  EKG: EKG from September 2022 demonstrates sinus rhythm with a right bundle branch block pattern  Imaging studies that I have independently reviewed today: RUQ u/s  Recent Labs: 01/21/2021: ALT 39; BUN 17; Creatinine, Ser 1.01; Hemoglobin 14.9; Platelets 239; Potassium 4.5; Sodium 141; TSH 2.650  Recent Lipid Panel    Component Value Date/Time   CHOL 148 01/21/2021 0902   TRIG 110 01/21/2021 0902   HDL 49 01/21/2021 0902   LDLCALC 79 01/21/2021 0902    Physical Exam:    VS:  BP 140/80   Pulse 91   Ht 6\' 1"  (1.854 m)   Wt (!) 345 lb (156.5 kg)   SpO2 96%   BMI 45.52 kg/m     Wt Readings from Last 5 Encounters:  03/17/21 (!) 345 lb (156.5 kg)  03/05/21 13/03/22)  335 lb (152 kg)  02/19/21 (!) 341 lb (154.7 kg)  01/21/21 (!) 349 lb (158.3 kg)  11/19/15 (!) 325 lb (147.4 kg)    GENERAL:  No apparent distress, AOx3, obese HEENT:  No carotid bruits, +2 carotid impulses, no scleral icterus CAR: RRR no murmurs, gallops, rubs, or thrills RES:  Clear to auscultation bilaterally ABD:  Soft, nontender, nondistended, positive bowel sounds x 4 VASC:  +2 radial pulses, +2 carotid pulses, palpable pedal pulses NEURO:  CN 2-12 grossly intact; motor and sensory grossly intact PSYCH:  No active depression or anxiety EXT:  No edema, ecchymosis, or cyanosis  ASSESSMENT:    1. Dyspnea, unspecified type   2. Hypertension, unspecified type   3. Hyperlipidemia, unspecified  hyperlipidemia type   4. Morbid obesity (HCC)   5. RBBB    PLAN:    Dyspnea, unspecified type Will obtain an echocardiogram.  I imagine this is likely due to the patient's body habitus and deconditioning.  He is scheduled for a sleep apnea evaluation.  I will keep follow up with me open-ended.  Hypertension, unspecified type His BP is stable today, can consider increasing Zestoretic at his next provider visit if BP > 130/80.  Hyperlipidemia, unspecified hyperlipidemia type This is being followed by his PCP.  Morbid obesity (HCC) He is in a weight loss program.  RBBB This is an incidental finding.  This should be monitored.  No further evaluation is necessary.  Shared Decision Making/Informed Consent:       Medication Adjustments/Labs and Tests Ordered: Current medicines are reviewed at length with the patient today.  Concerns regarding medicines are outlined above.   Orders Placed This Encounter  Procedures   ECHOCARDIOGRAM COMPLETE     No orders of the defined types were placed in this encounter.   Patient Instructions  Medication Instructions:  No changes *If you need a refill on your cardiac medications before your next appointment, please call your pharmacy*   Lab Work: none If you have labs (blood work) drawn today and your tests are completely normal, you will receive your results only by: MyChart Message (if you have MyChart) OR A paper copy in the mail If you have any lab test that is abnormal or we need to change your treatment, we will call you to review the results.   Testing/Procedures: Your physician has requested that you have an echocardiogram. Echocardiography is a painless test that uses sound waves to create images of your heart. It provides your doctor with information about the size and shape of your heart and how well your heart's chambers and valves are working. This procedure takes approximately one hour. There are no restrictions for this  procedure.   Follow-Up: As needed

## 2021-03-17 ENCOUNTER — Ambulatory Visit: Payer: BC Managed Care – PPO | Admitting: Internal Medicine

## 2021-03-17 ENCOUNTER — Encounter: Payer: Self-pay | Admitting: Internal Medicine

## 2021-03-17 ENCOUNTER — Other Ambulatory Visit: Payer: Self-pay

## 2021-03-17 VITALS — BP 140/80 | HR 91 | Ht 73.0 in | Wt 345.0 lb

## 2021-03-17 DIAGNOSIS — I10 Essential (primary) hypertension: Secondary | ICD-10-CM | POA: Diagnosis not present

## 2021-03-17 DIAGNOSIS — R0600 Dyspnea, unspecified: Secondary | ICD-10-CM | POA: Diagnosis not present

## 2021-03-17 DIAGNOSIS — E785 Hyperlipidemia, unspecified: Secondary | ICD-10-CM

## 2021-03-17 DIAGNOSIS — I4510 Unspecified right bundle-branch block: Secondary | ICD-10-CM

## 2021-03-17 NOTE — Patient Instructions (Signed)
Medication Instructions:  ?No changes ?*If you need a refill on your cardiac medications before your next appointment, please call your pharmacy* ? ? ?Lab Work: ?none ?If you have labs (blood work) drawn today and your tests are completely normal, you will receive your results only by: ?MyChart Message (if you have MyChart) OR ?A paper copy in the mail ?If you have any lab test that is abnormal or we need to change your treatment, we will call you to review the results. ? ? ?Testing/Procedures: ?Your physician has requested that you have an echocardiogram. Echocardiography is a painless test that uses sound waves to create images of your heart. It provides your doctor with information about the size and shape of your heart and how well your heart?s chambers and valves are working. This procedure takes approximately one hour. There are no restrictions for this procedure. ? ? ?Follow-Up: ?As needed ? ?  ?

## 2021-03-18 ENCOUNTER — Ambulatory Visit (INDEPENDENT_AMBULATORY_CARE_PROVIDER_SITE_OTHER): Payer: BC Managed Care – PPO | Admitting: Family Medicine

## 2021-03-18 ENCOUNTER — Encounter (INDEPENDENT_AMBULATORY_CARE_PROVIDER_SITE_OTHER): Payer: Self-pay | Admitting: Family Medicine

## 2021-03-18 VITALS — BP 112/71 | HR 84 | Temp 98.2°F | Ht 73.0 in | Wt 338.0 lb

## 2021-03-18 DIAGNOSIS — E559 Vitamin D deficiency, unspecified: Secondary | ICD-10-CM | POA: Diagnosis not present

## 2021-03-18 DIAGNOSIS — I10 Essential (primary) hypertension: Secondary | ICD-10-CM

## 2021-03-18 DIAGNOSIS — Z6841 Body Mass Index (BMI) 40.0 and over, adult: Secondary | ICD-10-CM | POA: Diagnosis not present

## 2021-03-18 MED ORDER — VITAMIN D (ERGOCALCIFEROL) 1.25 MG (50000 UNIT) PO CAPS: 50000.0000 [IU] | ORAL_CAPSULE | ORAL | 0 refills | Status: DC

## 2021-03-19 NOTE — Progress Notes (Signed)
Chief Complaint:   OBESITY Kevin [name] is here to discuss his progress with his obesity treatment plan along with follow-up of his obesity related diagnoses. Ravindra is on the Category 4 Plan and states he is following his eating plan approximately 75% of the time. [name] states he is hiking 160 minutes 1 times per week.  Today's visit was #: 4 Starting weight: 349 lbs Starting date: 01/21/2021 Today's weight: 338 lbs Today's date: 03/18/2021 Total lbs lost to date: 11 Total lbs lost since last in-office visit: 0  Interim History: Kevin [name] voices his schedule is as busy as it can be over the last few weeks. He is doing option of breakfast sandwich or yogurt. Lunch is challenging, as he is eating at the cafeteria daily. He doubts lunch portion is 4 oz of meat. Supper is meat and protein. He is often having to prep his own food. Kevin [name]'s biggest sabotage in the plentiful indulgent food he knows will be around.  Subjective:   1. Vitamin D deficiency Kevin [name] denies nausea, vomiting, and muscle weakness but notes fatigue. He is on prescription Vit D, and his last Vit D level was 26.4.  2. Essential hypertension BP well controlled. Kevin [name] denies chest pain/chest pressure/headache. He just saw cardiology and echocardiogram is pending.  Assessment/Plan:   1. Vitamin D deficiency Low Vitamin D level contributes to fatigue and are associated with obesity, breast, and colon cancer. He agrees to continue to take prescription Vitamin D 50,000 IU every week and will follow-up for routine testing of Vitamin D, at least 2-3 times per year to avoid over-replacement.  Refill- Vitamin D, Ergocalciferol, (DRISDOL) 1.25 MG (50000 UNIT) CAPS capsule; Take 1 capsule (50,000 Units total) by mouth every 7 (seven) days.  Dispense: 4 capsule; Refill: 0  2. Essential hypertension Alcides is working on healthy weight loss and exercise to improve blood pressure control. We will watch for signs of hypotension as he continues  his lifestyle modifications. Continue Zestoretic with no change in dosage.  3. Obesity with current BMI of 44.6  [name] is currently in the action stage of change. As such, his goal is to continue with weight loss efforts. He has agreed to the Category 4 Plan and keeping a food journal and adhering to recommended goals of 450-600 calories and 40+ grams protein with lunch and 550-700 calories and 50+ grams protein with supper.   Exercise goals: All adults should avoid inactivity. Some physical activity is better than none, and adults who participate in any amount of physical activity gain some health benefits.  Behavioral modification strategies: increasing lean protein intake, meal planning and cooking strategies, holiday eating strategies , and planning for success.  Mourad has agreed to follow-up with our clinic in 3 weeks. He was informed of the importance of frequent follow-up visits to maximize his success with intensive lifestyle modifications for his multiple health conditions.   Objective:   Blood pressure 112/71, pulse 84, temperature 98.2 F (36.8 C), height 6\' 1"  (1.854 m), weight (!) 338 lb (153.3 kg), SpO2 97 %. Body mass index is 44.59 kg/m.  General: Cooperative, alert, well developed, in no acute distress. HEENT: Conjunctivae and lids unremarkable. Cardiovascular: Regular rhythm.  Lungs: Normal work of breathing. Neurologic: No focal deficits.   Lab Results  Component Value Date   CREATININE 1.01 01/21/2021   BUN 17 01/21/2021   NA 141 01/21/2021   K 4.5 01/21/2021   CL 102 01/21/2021   CO2 22 01/21/2021   Lab Results  Component Value Date   ALT 39 01/21/2021   AST 30 01/21/2021   ALKPHOS 104 01/21/2021   BILITOT 0.4 01/21/2021   Lab Results  Component Value Date   HGBA1C 6.2 (H) 01/21/2021   Lab Results  Component Value Date   INSULIN 30.7 (H) 01/21/2021   Lab Results  Component Value Date   TSH 2.650 01/21/2021   Lab Results  Component Value  Date   CHOL 148 01/21/2021   HDL 49 01/21/2021   LDLCALC 79 01/21/2021   TRIG 110 01/21/2021   Lab Results  Component Value Date   VD25OH 26.4 (L) 01/21/2021   Lab Results  Component Value Date   WBC 4.8 01/21/2021   HGB 14.9 01/21/2021   HCT 44.5 01/21/2021   MCV 91 01/21/2021   PLT 239 01/21/2021    Attestation Statements:   Reviewed by clinician on day of visit: allergies, medications, problem list, medical history, surgical history, family history, social history, and previous encounter notes.  Edmund Hilda, CMA, am acting as transcriptionist for Reuben Likes, MD.   I have reviewed the above documentation for accuracy and completeness, and I agree with the above. - Reuben Likes, MD

## 2021-04-03 ENCOUNTER — Ambulatory Visit (HOSPITAL_COMMUNITY): Payer: BC Managed Care – PPO | Attending: Internal Medicine

## 2021-04-03 ENCOUNTER — Other Ambulatory Visit: Payer: Self-pay

## 2021-04-03 DIAGNOSIS — I10 Essential (primary) hypertension: Secondary | ICD-10-CM | POA: Diagnosis not present

## 2021-04-03 DIAGNOSIS — E785 Hyperlipidemia, unspecified: Secondary | ICD-10-CM | POA: Insufficient documentation

## 2021-04-03 DIAGNOSIS — R0600 Dyspnea, unspecified: Secondary | ICD-10-CM | POA: Insufficient documentation

## 2021-04-03 LAB — ECHOCARDIOGRAM COMPLETE
Area-P 1/2: 3.34 cm2
S' Lateral: 2.4 cm

## 2021-04-06 ENCOUNTER — Institutional Professional Consult (permissible substitution): Payer: BC Managed Care – PPO | Admitting: Neurology

## 2021-04-09 ENCOUNTER — Other Ambulatory Visit: Payer: Self-pay

## 2021-04-09 ENCOUNTER — Encounter (INDEPENDENT_AMBULATORY_CARE_PROVIDER_SITE_OTHER): Payer: Self-pay | Admitting: Family Medicine

## 2021-04-09 ENCOUNTER — Ambulatory Visit (INDEPENDENT_AMBULATORY_CARE_PROVIDER_SITE_OTHER): Payer: BC Managed Care – PPO | Admitting: Family Medicine

## 2021-04-09 VITALS — BP 115/75 | HR 80 | Temp 97.9°F | Ht 73.0 in | Wt 336.0 lb

## 2021-04-09 DIAGNOSIS — I10 Essential (primary) hypertension: Secondary | ICD-10-CM | POA: Diagnosis not present

## 2021-04-09 DIAGNOSIS — J302 Other seasonal allergic rhinitis: Secondary | ICD-10-CM

## 2021-04-09 DIAGNOSIS — E7849 Other hyperlipidemia: Secondary | ICD-10-CM

## 2021-04-09 DIAGNOSIS — Z9189 Other specified personal risk factors, not elsewhere classified: Secondary | ICD-10-CM | POA: Diagnosis not present

## 2021-04-09 DIAGNOSIS — E559 Vitamin D deficiency, unspecified: Secondary | ICD-10-CM

## 2021-04-09 DIAGNOSIS — Z6841 Body Mass Index (BMI) 40.0 and over, adult: Secondary | ICD-10-CM

## 2021-04-09 MED ORDER — VITAMIN D (ERGOCALCIFEROL) 1.25 MG (50000 UNIT) PO CAPS: 50000.0000 [IU] | ORAL_CAPSULE | ORAL | 0 refills | Status: DC

## 2021-04-09 NOTE — Progress Notes (Signed)
Chief Complaint:   OBESITY Kevin [name] is here to discuss his progress with his obesity treatment plan along with follow-up of his obesity related diagnoses. Kevin [name] is on the Category 4 Plan and keeping a food journal and adhering to recommended goals of 450-600 calories and 40+ grams of protein at lunch daily, and 550-700 calories and 50+ grams of protein at supper daily and states he is following his eating plan approximately 50-60% of the time. Kevin [name] states he was hiking 1 time per week.  Today's visit was #: 5 Starting weight: 349 lbs Starting date: 01/21/2021 Today's weight: 336 lbs Today's date: 04/09/2021 Total lbs lost to date: 13 Total lbs lost since last in-office visit: 2  Interim History: Kevin [name] was glad that he lost weight over the holiday. Kevin [name] is here for a follow up office visit. We reviewed his meal plan and questions were answered. Patient's food recall appears to be accurate and consistent with what is on plan when he is following it. When eating on plan, his hunger and cravings are well controlled.    Subjective:   1. Essential hypertension Kevin [name]'s blood pressure is at goal today. He had an echocardiogram recently due to RBBB on his ECG and he saw Cardiology. Cardiology notes were reviewed. His blood pressure goal is < 130/80 and his echocardiogram was within normal limits.  BP Readings from Last 3 Encounters:  04/09/21 115/75  03/18/21 112/71  03/17/21 140/80   2. Vitamin D deficiency Kevin [name] is currently taking prescription vitamin D 50,000 IU each week. He denies nausea, vomiting or muscle weakness.  3. Other hyperlipidemia Kevin [name] takes Lipitor q AM, and he drinks about 30-90 oz of water per day. He is tolerating medication(s) well without side effects. Medication compliance is good and patient appears to be taking it as prescribed. Denies additional concerns regarding this condition.   4. Seasonal allergies Ladd has seasonal allergies  that have been more bothersome with the change of weather/season. His symptoms are currently controlled.  5. At risk for diabetes mellitus Kevin [name] is at higher than average risk for developing diabetes due to pre-diabetes.   Assessment/Plan:  No orders of the defined types were placed in this encounter.   Medications Discontinued During This Encounter  Medication Reason   Vitamin D, Ergocalciferol, (DRISDOL) 1.25 MG (50000 UNIT) CAPS capsule Reorder     Meds ordered this encounter  Medications   Vitamin D, Ergocalciferol, (DRISDOL) 1.25 MG (50000 UNIT) CAPS capsule    Sig: Take 1 capsule (50,000 Units total) by mouth every 7 (seven) days.    Dispense:  4 capsule    Refill:  0     1. Essential hypertension Kevin [name] is going for and obstructive sleep apnea evaluation in January with Dr. Frances Furbish. He will continue his medications and close blood pressure monitoring.    2. Vitamin D deficiency We will refill prescription Vitamin D for 1 month. Kevin [name] will follow-up for routine testing of Vitamin D, at least 2-3 times per year to avoid over-replacement.  - Vitamin D, Ergocalciferol, (DRISDOL) 1.25 MG (50000 UNIT) CAPS capsule; Take 1 capsule (50,000 Units total) by mouth every 7 (seven) days.  Dispense: 4 capsule; Refill: 0  3. Other hyperlipidemia Cardiovascular risk and specific lipid/LDL goals reviewed. We discussed several lifestyle modifications today. Kevin [name] will continue to decrease saturated and trans fats. Orders and follow up as documented in patient record.   Counseling Intensive lifestyle modifications are the first line treatment for this issue. Dietary  changes: Increase soluble fiber. Decrease simple carbohydrates. Exercise changes: Moderate to vigorous-intensity aerobic activity 150 minutes per week if tolerated. Lipid-lowering medications: see documented in medical record.  4. Seasonal allergies Kevin [name] will continue Xyzal qhs, and we will follow up at his next office  visit.   - Seasonal and environmental allergies and pathophysiology of disease process discussed with patient.  - Preventative strategies as first line for management discussed.  I encouraged use of KN or N-95 mask use as well as sterile saline rinses such as Lloyd Huger Med or AYR sinus rinses to be done twice daily and after any prolonged exposure to the environment or allergen.    It is best to use distilled water or previously boiled water    - If eyes are itchy or irritated feeling when your seasonal allergies get bad, ok to use Naphcon-A over-the-counter eyedrops as needed - If continues to worsen despite preventative strategies, take over-the-counter meds such as Allegra, Xyzal etc daily during allergy seasons or nasal steroids such as Flonase, Rhinocort and the like - We can consider Astelin nasal spray, if symptoms are not well controlled with OTC medications, nasal rinses and preventative strategies. - Encouraged to return to clinic or call the office today discussed further questions or concerns they may have.  5. At risk for diabetes mellitus Kevin [name] was given approximately 9 minutes of diabetes education and counseling today. We discussed intensive lifestyle modifications today with an emphasis on weight loss as well as increasing exercise and decreasing simple carbohydrates in his diet. We also reviewed medication options with an emphasis on risk versus benefit of those discussed.   Repetitive spaced learning was employed today to elicit superior memory formation and behavioral change.  6. Obesity with current BMI of 44.4 Kevin [name] is currently in the action stage of change. As such, his goal is to continue with weight loss efforts. He has agreed to the Category 4 Plan and keeping a food journal and adhering to recommended goals of 450-600 calories and 40+ grams of protein at lunch daily, and 550-700 calories and 50+ grams of protein at supper daily.   Exercise goals: Increase exercise/movement  over the holidays.  Behavioral modification strategies: increasing lean protein intake, decreasing simple carbohydrates, and planning for success.  Kevin [name] has agreed to follow-up with our clinic in 2 to 4 weeks. He was informed of the importance of frequent follow-up visits to maximize his success with intensive lifestyle modifications for his multiple health conditions.   Objective:   Blood pressure 115/75, pulse 80, temperature 97.9 F (36.6 C), height 6\' 1"  (1.854 m), weight (!) 336 lb (152.4 kg), SpO2 92 %. Body mass index is 44.33 kg/m.  General: Cooperative, alert, well developed, in no acute distress. HEENT: Conjunctivae and lids unremarkable. Cardiovascular: Regular rhythm.  Lungs: Normal work of breathing. Neurologic: No focal deficits.   Lab Results  Component Value Date   CREATININE 1.01 01/21/2021   BUN 17 01/21/2021   NA 141 01/21/2021   K 4.5 01/21/2021   CL 102 01/21/2021   CO2 22 01/21/2021   Lab Results  Component Value Date   ALT 39 01/21/2021   AST 30 01/21/2021   ALKPHOS 104 01/21/2021   BILITOT 0.4 01/21/2021   Lab Results  Component Value Date   HGBA1C 6.2 (H) 01/21/2021   Lab Results  Component Value Date   INSULIN 30.7 (H) 01/21/2021   Lab Results  Component Value Date   TSH 2.650 01/21/2021   Lab Results  Component  Value Date   CHOL 148 01/21/2021   HDL 49 01/21/2021   LDLCALC 79 01/21/2021   TRIG 110 01/21/2021   Lab Results  Component Value Date   VD25OH 26.4 (L) 01/21/2021   Lab Results  Component Value Date   WBC 4.8 01/21/2021   HGB 14.9 01/21/2021   HCT 44.5 01/21/2021   MCV 91 01/21/2021   PLT 239 01/21/2021   No results found for: IRON, TIBC, FERRITIN  Attestation Statements:   Reviewed by clinician on day of visit: allergies, medications, problem list, medical history, surgical history, family history, social history, and previous encounter notes.   Trude Mcburney, am acting as transcriptionist for [employer], DO.  I have reviewed the above documentation for accuracy and completeness, and I agree with the above. Carlye Grippe, D.O.  The 21st Century Cures Act was signed into law in 2016 which includes the topic of electronic health records.  This provides immediate access to information in MyChart.  This includes consultation notes, operative notes, office notes, lab results and pathology reports.  If you have any questions about what you read please let us know at your next visit so we can discuss your concerns and take corrective action if need be.  We are right here with you.

## 2021-04-21 DIAGNOSIS — L538 Other specified erythematous conditions: Secondary | ICD-10-CM | POA: Diagnosis not present

## 2021-04-21 DIAGNOSIS — Z789 Other specified health status: Secondary | ICD-10-CM | POA: Diagnosis not present

## 2021-04-21 DIAGNOSIS — L918 Other hypertrophic disorders of the skin: Secondary | ICD-10-CM | POA: Diagnosis not present

## 2021-04-21 DIAGNOSIS — R208 Other disturbances of skin sensation: Secondary | ICD-10-CM | POA: Diagnosis not present

## 2021-04-21 DIAGNOSIS — L718 Other rosacea: Secondary | ICD-10-CM | POA: Diagnosis not present

## 2021-04-28 DIAGNOSIS — Z23 Encounter for immunization: Secondary | ICD-10-CM | POA: Diagnosis not present

## 2021-05-07 ENCOUNTER — Other Ambulatory Visit: Payer: Self-pay

## 2021-05-07 ENCOUNTER — Ambulatory Visit (INDEPENDENT_AMBULATORY_CARE_PROVIDER_SITE_OTHER): Payer: BC Managed Care – PPO | Admitting: Family Medicine

## 2021-05-07 ENCOUNTER — Encounter (INDEPENDENT_AMBULATORY_CARE_PROVIDER_SITE_OTHER): Payer: Self-pay | Admitting: Family Medicine

## 2021-05-07 VITALS — BP 115/72 | HR 85 | Temp 98.0°F | Ht 73.0 in | Wt 333.0 lb

## 2021-05-07 DIAGNOSIS — Z6841 Body Mass Index (BMI) 40.0 and over, adult: Secondary | ICD-10-CM

## 2021-05-07 DIAGNOSIS — I10 Essential (primary) hypertension: Secondary | ICD-10-CM

## 2021-05-07 DIAGNOSIS — E559 Vitamin D deficiency, unspecified: Secondary | ICD-10-CM | POA: Diagnosis not present

## 2021-05-07 MED ORDER — HYDROCHLOROTHIAZIDE 12.5 MG PO TABS: 12.5000 mg | ORAL_TABLET | Freq: Every day | ORAL | 0 refills | Status: DC

## 2021-05-07 MED ORDER — VITAMIN D (ERGOCALCIFEROL) 1.25 MG (50000 UNIT) PO CAPS: 50000.0000 [IU] | ORAL_CAPSULE | ORAL | 0 refills | Status: DC

## 2021-05-11 NOTE — Progress Notes (Signed)
Chief Complaint:   OBESITY Kevin [name] is here to discuss his progress with his obesity treatment plan along with follow-up of his obesity related diagnoses. Kevin [name] is on the Category 4 Plan and keeping a food journal and adhering to recommended goals of 450-600 calories and 40+ grams protein with lunch and 550-700 calories and 50+ grams with dinner and states he is following his eating plan approximately 50% of the time. Kevin [name] states he is doing 5,000 steps.  Today's visit was #: 6 Starting weight: 349 lbs Starting date: 01/21/2021 Today's weight: 333 lbs Today's date: 05/07/2021 Total lbs lost to date: 16 Total lbs lost since last in-office visit: 3  Interim History: Kevin [name] had a difficult few weeks, as his wife was in the hospital for gallstones and had significant complications. He really didn't follow the plan much while his wife was hospitalized. He wants to get back on track, which is the meal plan. Pt doesn't anticipate any upcoming obstacles.  Subjective:   1. Essential hypertension BP has been well controlled for the last 3 visits. He is on Zestoretic 10-12.5 mg.  2. Vitamin D deficiency Pt denies nausea, vomiting, and muscle weakness but notes fatigue. He is on prescription Vit D. His last Vit D level was 26.  Assessment/Plan:   1. Essential hypertension Kevin [name] will discontinue Zestoretic and start HCTZ. He is working on healthy weight loss and exercise to improve blood pressure control. We will watch for signs of hypotension as he continues his lifestyle modifications.  Start- hydrochlorothiazide (HYDRODIURIL) 12.5 MG tablet; Take 1 tablet (12.5 mg total) by mouth daily.  Dispense: 30 tablet; Refill: 0  2. Vitamin D deficiency Low Vitamin D level contributes to fatigue and are associated with obesity, breast, and colon cancer. He agrees to continue to take prescription Vitamin D 50,000 IU every week and will follow-up for routine testing of Vitamin D, at least 2-3  times per year to avoid over-replacement.  Refill- Vitamin D, Ergocalciferol, (DRISDOL) 1.25 MG (50000 UNIT) CAPS capsule; Take 1 capsule (50,000 Units total) by mouth every 7 (seven) days.  Dispense: 4 capsule; Refill: 0  3. Obesity with current BMI of 44.0  Kevin [name] is currently in the action stage of change. As such, his goal is to continue with weight loss efforts. He has agreed to the Category 4 Plan and keeping a food journal and adhering to recommended goals of 450-600 calories and 40+ grams protein with lunch and 550-700 calories and 50+ grams with supper.   Exercise goals: All adults should avoid inactivity. Some physical activity is better than none, and adults who participate in any amount of physical activity gain some health benefits.  Behavioral modification strategies: increasing lean protein intake, meal planning and cooking strategies, keeping healthy foods in the home, and planning for success.  Kevin [name] has agreed to follow-up with our clinic in 3 weeks. He was informed of the importance of frequent follow-up visits to maximize his success with intensive lifestyle modifications for his multiple health conditions.   Objective:   Blood pressure 115/72, pulse 85, temperature 98 F (36.7 C), height 6\' 1"  (1.854 m), weight (!) 333 lb (151 kg), SpO2 96 %. Body mass index is 43.93 kg/m.  General: Cooperative, alert, well developed, in no acute distress. HEENT: Conjunctivae and lids unremarkable. Cardiovascular: Regular rhythm.  Lungs: Normal work of breathing. Neurologic: No focal deficits.   Lab Results  Component Value Date   CREATININE 1.01 01/21/2021   BUN 17 01/21/2021  NA 141 01/21/2021   K 4.5 01/21/2021   CL 102 01/21/2021   CO2 22 01/21/2021   Lab Results  Component Value Date   ALT 39 01/21/2021   AST 30 01/21/2021   ALKPHOS 104 01/21/2021   BILITOT 0.4 01/21/2021   Lab Results  Component Value Date   HGBA1C 6.2 (H) 01/21/2021   Lab Results   Component Value Date   INSULIN 30.7 (H) 01/21/2021   Lab Results  Component Value Date   TSH 2.650 01/21/2021   Lab Results  Component Value Date   CHOL 148 01/21/2021   HDL 49 01/21/2021   LDLCALC 79 01/21/2021   TRIG 110 01/21/2021   Lab Results  Component Value Date   VD25OH 26.4 (L) 01/21/2021   Lab Results  Component Value Date   WBC 4.8 01/21/2021   HGB 14.9 01/21/2021   HCT 44.5 01/21/2021   MCV 91 01/21/2021   PLT 239 01/21/2021    Attestation Statements:   Reviewed by clinician on day of visit: allergies, medications, problem list, medical history, surgical history, family history, social history, and previous encounter notes.  Edmund Hilda, CMA, am acting as transcriptionist for Reuben Likes, MD.   I have reviewed the above documentation for accuracy and completeness, and I agree with the above. - Reuben Likes, MD

## 2021-05-19 DIAGNOSIS — D128 Benign neoplasm of rectum: Secondary | ICD-10-CM | POA: Diagnosis not present

## 2021-05-19 DIAGNOSIS — K644 Residual hemorrhoidal skin tags: Secondary | ICD-10-CM | POA: Diagnosis not present

## 2021-05-19 DIAGNOSIS — D125 Benign neoplasm of sigmoid colon: Secondary | ICD-10-CM | POA: Diagnosis not present

## 2021-05-19 DIAGNOSIS — D124 Benign neoplasm of descending colon: Secondary | ICD-10-CM | POA: Diagnosis not present

## 2021-05-19 DIAGNOSIS — Z1211 Encounter for screening for malignant neoplasm of colon: Secondary | ICD-10-CM | POA: Diagnosis not present

## 2021-05-21 ENCOUNTER — Ambulatory Visit (INDEPENDENT_AMBULATORY_CARE_PROVIDER_SITE_OTHER): Payer: BC Managed Care – PPO | Admitting: Family Medicine

## 2021-05-28 ENCOUNTER — Ambulatory Visit (INDEPENDENT_AMBULATORY_CARE_PROVIDER_SITE_OTHER): Payer: BC Managed Care – PPO | Admitting: Physician Assistant

## 2021-05-28 DIAGNOSIS — I10 Essential (primary) hypertension: Secondary | ICD-10-CM | POA: Diagnosis not present

## 2021-06-01 ENCOUNTER — Institutional Professional Consult (permissible substitution): Payer: BC Managed Care – PPO | Admitting: Neurology

## 2021-06-03 ENCOUNTER — Ambulatory Visit (INDEPENDENT_AMBULATORY_CARE_PROVIDER_SITE_OTHER): Payer: BC Managed Care – PPO | Admitting: Family Medicine

## 2021-06-03 ENCOUNTER — Other Ambulatory Visit: Payer: Self-pay

## 2021-06-03 ENCOUNTER — Encounter (INDEPENDENT_AMBULATORY_CARE_PROVIDER_SITE_OTHER): Payer: Self-pay | Admitting: Family Medicine

## 2021-06-03 VITALS — BP 133/82 | HR 72 | Temp 97.9°F | Ht 73.0 in | Wt 331.0 lb

## 2021-06-03 DIAGNOSIS — I10 Essential (primary) hypertension: Secondary | ICD-10-CM

## 2021-06-03 DIAGNOSIS — E559 Vitamin D deficiency, unspecified: Secondary | ICD-10-CM | POA: Diagnosis not present

## 2021-06-03 DIAGNOSIS — E669 Obesity, unspecified: Secondary | ICD-10-CM

## 2021-06-03 DIAGNOSIS — Z6841 Body Mass Index (BMI) 40.0 and over, adult: Secondary | ICD-10-CM | POA: Diagnosis not present

## 2021-06-03 DIAGNOSIS — Z9189 Other specified personal risk factors, not elsewhere classified: Secondary | ICD-10-CM

## 2021-06-03 MED ORDER — VITAMIN D (ERGOCALCIFEROL) 1.25 MG (50000 UNIT) PO CAPS: 50000.0000 [IU] | ORAL_CAPSULE | ORAL | 0 refills | Status: DC

## 2021-06-03 NOTE — Progress Notes (Signed)
Chief Complaint:   OBESITY Kevin [name] is here to discuss his progress with his obesity treatment plan along with follow-up of his obesity related diagnoses. Kevin [name] is on the Category 4 Plan and keeping a food journal and adhering to recommended goals of 450-600 calories and 40+ grams of protein at lunch, and 500-700 calories and 50+ grams of protein at supper daily and states he is following his eating plan approximately 50-60% of the time. Kevin [name] states he is hiking and walking for 60-120 minutes 2 times per week.  Today's visit was #: 7 Starting weight: 349 lbs Starting date: 01/21/2021 Today's weight: 331 lbs Today's date: 06/03/2021 Total lbs lost to date: 18 Total lbs lost since last in-office visit: 2  Interim History:  Kevin [name] has a lot of stress in his life right now - wife is sick. She was at  [facility] for 11 days in the hospital.    He has been unable to meal plan or prep, but still lost today.  About 50% of the time he eats on-plan.  Patient's food recall appears to be accurate and consistent with what is on plan when he is following it.  When eating on plan, his hunger and cravings are well controlled.       Subjective:   1. Essential hypertension At his last OV with [place] on 05/07/21, he was switched to just HCTZ 12.5mg  QD and lisinopril 10mg  was d/ced.  Pt endorses he did not feel well after that med change.  His BP was running 150's/90's- 160's/100's and he had HA's, felt weird and "just didn't feel well".  He did not contact about his concerns at that time.  Kevin [name]'s primary care physician changed him back to his Zestoretic due to elevated blood pressures on just hydrochlorothiazide alone. BMP repeat was normal per pt.   [name] is feeling much better now and back to normal.  He has no more headaches and blood pressures have come down at home to under 140/90.  2. Vitamin D deficiency Kevin [name] is currently taking prescription vitamin D 50,000 IU each week. He denies nausea,  vomiting or muscle weakness.  3. At risk for complication associated with hypotension Kevin [name] is at a higher than average risk of hypotension due to medication treatment of hypertension, and losing weight.   Assesment/Plan:  No orders of the defined types were placed in this encounter.   Medications Discontinued During This Encounter  Medication Reason   hydrochlorothiazide (HYDRODIURIL) 12.5 MG tablet    Vitamin D, Ergocalciferol, (DRISDOL) 1.25 MG (50000 UNIT) CAPS capsule Reorder     Meds ordered this encounter  Medications   Vitamin D, Ergocalciferol, (DRISDOL) 1.25 MG (50000 UNIT) CAPS capsule    Sig: Take 1 capsule (50,000 Units total) by mouth every 7 (seven) days.    Dispense:  4 capsule    Refill:  0     1. Essential hypertension Kevin [name] will continue Zestoretic and check his blood pressure at home. His goal is <140/90 on a regular basis. Hypotension symptoms were reviewed with the patient. Consider change in dose to 1/2 tablet of Zestoretic if he develops symptoms of 100/50's.   2. Vitamin D deficiency Low Vitamin D level contributes to fatigue and are associated with obesity, breast, and colon cancer. We will refill prescription Vitamin D 50,000 IU every week for 1 month. Kevin [name] will follow-up for routine testing of Vitamin D, at least 2-3 times per year to avoid over-replacement.  - Vitamin D, Ergocalciferol, (DRISDOL)  1.25 MG (50000 UNIT) CAPS capsule; Take 1 capsule (50,000 Units total) by mouth every 7 (seven) days.  Dispense: 4 capsule; Refill: 0   3. At risk for complication associated with hypotension Kevin [name] was given approximately 9 minutes of education and counseling today to help avoid hypotension. We discussed risks of hypotension with weight loss and signs of hypotension such as feeling lightheaded or unsteady.  Repetitive spaced learning was employed today to elicit superior memory formation and behavioral change.   4. Obesity with current BMI of  43.7 Kevin [name] is currently in the action stage of change. As such, his goal is to continue with weight loss efforts. He has agreed to the Category 4 Plan and keeping a food journal and adhering to recommended goals of 450-550 calories and 35+ grams of protein just at breakfast only.  Kevin [name] had blood work done with his primary care physician at [place] last week. Next office visit he is to come fasting for Vit D, fasting insulin, and A1c. He is to bring in his labs result -CMP and whatever others, from his recent PCP OV.  Exercise goals: As is.  Behavioral modification strategies: increasing lean protein intake, decreasing simple carbohydrates, and planning for success.  Kevin [name] has agreed to follow-up with our clinic in 3 weeks. He was informed of the importance of frequent follow-up visits to maximize his success with intensive lifestyle modifications for his multiple health conditions.     Objective:   Blood pressure 133/82, pulse 72, temperature 97.9 F (36.6 C), height 6\' 1"  (1.854 m), weight (!) 331 lb (150.1 kg), SpO2 97 %. Body mass index is 43.67 kg/m.  General: Cooperative, alert, well developed, in no acute distress. HEENT: Conjunctivae and lids unremarkable. Cardiovascular: Regular rhythm.  Lungs: Normal work of breathing. Neurologic: No focal deficits.   Lab Results  Component Value Date   CREATININE 1.01 01/21/2021   BUN 17 01/21/2021   NA 141 01/21/2021   K 4.5 01/21/2021   CL 102 01/21/2021   CO2 22 01/21/2021   Lab Results  Component Value Date   ALT 39 01/21/2021   AST 30 01/21/2021   ALKPHOS 104 01/21/2021   BILITOT 0.4 01/21/2021   Lab Results  Component Value Date   HGBA1C 6.2 (H) 01/21/2021   Lab Results  Component Value Date   INSULIN 30.7 (H) 01/21/2021   Lab Results  Component Value Date   TSH 2.650 01/21/2021   Lab Results  Component Value Date   CHOL 148 01/21/2021   HDL 49 01/21/2021   LDLCALC 79 01/21/2021   TRIG 110 01/21/2021    Lab Results  Component Value Date   VD25OH 26.4 (L) 01/21/2021   Lab Results  Component Value Date   WBC 4.8 01/21/2021   HGB 14.9 01/21/2021   HCT 44.5 01/21/2021   MCV 91 01/21/2021   PLT 239 01/21/2021   No results found for: IRON, TIBC, FERRITIN  Attestation Statements:   Reviewed by clinician on day of visit: allergies, medications, problem list, medical history, surgical history, family history, social history, and previous encounter notes.   01/23/2021, am acting as transcriptionist for Trude Mcburney, DO.  I have reviewed the above documentation for accuracy and completeness, and I agree with the above. [employer], D.O.  The 21st Century Cures Act was signed into law in 2016 which includes the topic of electronic health records.  This provides immediate access to information in MyChart.  This includes consultation notes, operative  notes, office notes, lab results and pathology reports.  If you have any questions about what you read please let us know at your next visit so we can discuss your concerns and take corrective action if need be.  We are right here with you.

## 2021-06-23 DIAGNOSIS — L718 Other rosacea: Secondary | ICD-10-CM | POA: Diagnosis not present

## 2021-07-01 ENCOUNTER — Ambulatory Visit: Payer: BC Managed Care – PPO | Admitting: Neurology

## 2021-07-01 ENCOUNTER — Encounter: Payer: Self-pay | Admitting: Neurology

## 2021-07-01 VITALS — BP 135/70 | HR 94 | Ht 74.0 in | Wt 342.0 lb

## 2021-07-01 DIAGNOSIS — R0681 Apnea, not elsewhere classified: Secondary | ICD-10-CM

## 2021-07-01 DIAGNOSIS — R0683 Snoring: Secondary | ICD-10-CM

## 2021-07-01 DIAGNOSIS — R351 Nocturia: Secondary | ICD-10-CM

## 2021-07-01 DIAGNOSIS — G4719 Other hypersomnia: Secondary | ICD-10-CM

## 2021-07-01 DIAGNOSIS — Z820 Family history of epilepsy and other diseases of the nervous system: Secondary | ICD-10-CM

## 2021-07-01 NOTE — Patient Instructions (Signed)

## 2021-07-01 NOTE — Progress Notes (Signed)
Subjective:    Patient ID: Kevin [name] is a 51 y.o. male.  HPI    Star Age, MD, PhD [facility] [address], [address]O. [place], [place] [address]  Dear Dr. Jearld Shines,   I saw your patient, Kevin [name], upon your kind request of the sleep clinic today for initial consultation of his sleep disorder, in particular, concern for underlying obstructive sleep apnea.  The patient is unaccompanied today.  As you know, Mr. Tray is a 51 year old right-handed gentleman with an underlying medical history of hypertension, hyperlipidemia, fatty liver, shortness of breath, and severe obesity with a BMI of over 40, who reports snoring and excessive daytime somnolence, as well as witnessed apneas per wife's report.  He reports that his brother was recently diagnosed with sleep apnea and has a CPAP machine.  I reviewed your office note from 01/21/2021.  His Epworth sleepiness score is 6 out of 24, fatigue severity score is 19 out of 63.  He does admit to feeling sleepy when he is sedentary.  He works from [employer] for [employer], 4 days in the office and 1 day from home.  He lives with his wife and 59 year old son.  He is working on weight loss and has lost about 20 pounds since starting his weight loss program.  He has nocturia about once per average night, denies recurrent morning headaches.  He has a bedtime between 10 and midnight and rise time generally between 630 and 7 AM.  He drinks caffeine in the form of coffee, about 3 to 4 cups before lunchtime.  He drinks alcohol very occasionally, he is a non-smoker.  They have 1 dog in the household and the dog sleeps in the bedroom with them.  He does not watch TV in his bedroom.   His Past Medical History Is Significant For: Past Medical History:  Diagnosis Date   Fatty liver    Hyperlipidemia    Hypertension     His Past Surgical History Is Significant For: Past Surgical History:  Procedure Laterality Date    COLONOSCOPY     ROTATOR CUFF REPAIR Left 2017    His Family History Is Significant For: Family History  Problem Relation Age of Onset   Stroke Father    Hyperlipidemia Father    Hypertension Father    Heart disease Father    Obesity Father    Sleep apnea Brother     His Social History Is Significant For: Social History   Socioeconomic History   Marital status: Married    Spouse name: Not on file   Number of children: Not on file   Years of education: Not on file   Highest education level: Not on file  Occupational History   Occupation: Sr Buyer, retail: [employer]  Tobacco Use   Smoking status: Never   Smokeless tobacco: Never  Vaping Use   Vaping Use: Never used  Substance and Sexual Activity   Alcohol use: Yes    Comment: occasional only   Drug use: Never   Sexual activity: Not on file  Other Topics Concern   Not on file  Social History Narrative   Lives at home with wife & son   Right handed   Caffeine: 3-4 cups/day   Social Determinants of Health   Financial Resource Strain: Not on file  Food Insecurity: Not on file  Transportation Needs: Not on file  Physical Activity: Not on file  Stress: Not on file  Social Connections: Not on file    His Allergies Are:  No Known Allergies:   His Current Medications Are:  Outpatient Encounter Medications as of 07/01/2021  Medication Sig   ATORVASTATIN CALCIUM PO Take 20 mg by mouth.   fluticasone (FLONASE) 50 MCG/ACT nasal spray Place into both nostrils daily.   levocetirizine (XYZAL) 5 MG tablet Take 5 mg by mouth every evening.   lisinopril-hydrochlorothiazide (ZESTORETIC) 10-12.5 MG tablet Take 1 tablet by mouth daily.   Vitamin D, Ergocalciferol, (DRISDOL) 1.25 MG (50000 UNIT) CAPS capsule Take 1 capsule (50,000 Units total) by mouth every 7 (seven) days.   No facility-administered encounter medications on file as of 07/01/2021.  :   Review of Systems:  Out of a complete 14 point  review of systems, all are reviewed and negative with the exception of these symptoms as listed below:  Review of Systems  Neurological:        Patient is here alone for a sleep consult. He reports his wife states he snores. If he is at home sitting still he can fall asleep easily, but when at work he is ok. ESS 6 FSS 19.   Objective:  Neurological Exam  Physical Exam Physical Examination:   Vitals:   07/01/21 1502  BP: 135/70  Pulse: 94    General Examination: The patient is a very pleasant 50 y.o. male in no acute distress. He appears well-developed and well-nourished and well groomed.   HEENT: Normocephalic, atraumatic, pupils are equal, round and reactive to light, extraocular tracking is good without limitation to gaze excursion or nystagmus noted. Hearing is grossly intact. Face is symmetric with normal facial animation. Speech is clear with no dysarthria noted. There is no hypophonia. There is no lip, neck/head, jaw or voice tremor. Neck is supple with full range of passive and active motion. There are no carotid bruits on auscultation. Oropharynx exam reveals: mild mouth dryness, adequate dental hygiene and moderate airway crowding, due to redundant soft palate, Mallampati class IV, tonsils and tip of uvula not fully visualized.  Neck circumference of 19-7/8 inches.  No significant overbite noted.  Tongue protrudes centrally.  Chest: Clear to auscultation without wheezing, rhonchi or crackles noted.  Heart: S1+S2+0, regular and normal without murmurs, rubs or gallops noted.   Abdomen: Soft, non-tender and non-distended.  Extremities: There is no pitting edema in the distal lower extremities bilaterally.   Skin: Warm and dry without trophic changes noted.   Musculoskeletal: exam reveals no obvious joint deformities.   Neurologically:  Mental status: The patient is awake, alert and oriented in all 4 spheres. His immediate and remote memory, attention, language skills and fund  of knowledge are appropriate. There is no evidence of aphasia, agnosia, apraxia or anomia. Speech is clear with normal prosody and enunciation. Thought process is linear. Mood is normal and affect is normal.  Cranial nerves II - XII are as described above under HEENT exam.  Motor exam: Normal bulk, strength and tone is noted. There is no tremor, Romberg is negative. Reflexes are 2+ throughout. Fine motor skills and coordination: grossly intact.  Cerebellar testing: No dysmetria or intention tremor. There is no truncal or gait ataxia.  Sensory exam: intact to light touch in the upper and lower extremities.  Gait, station and balance: He stands easily. No veering to one side is noted. No leaning to one side is noted. Posture is age-appropriate and stance is narrow based. Gait shows normal stride length and  normal pace. No problems turning are noted.  Assessment and Plan:   In summary, GIULIANO BUCKMASTER is a very pleasant 51 y.o.-year old male with an underlying medical history of hypertension, hyperlipidemia, fatty liver, shortness of breath, and severe obesity with a BMI of over 40, whose history and physical exam are concerning for obstructive sleep apnea (OSA). I had a long chat with the patient about my findings and the diagnosis of OSA, its prognosis and treatment options. We talked about medical treatments, surgical interventions and non-pharmacological approaches. I explained in particular the risks and ramifications of untreated moderate to severe OSA, especially with respect to developing cardiovascular disease down the Road, including congestive heart failure, difficult to treat hypertension, cardiac arrhythmias, or stroke. Even type 2 diabetes has, in part, been linked to untreated OSA. Symptoms of untreated OSA include daytime sleepiness, memory problems, mood irritability and mood disorder such as depression and anxiety, lack of energy, as well as recurrent headaches, especially morning  headaches. We talked about trying to maintain a healthy lifestyle in general, as well as the importance of weight control. We also talked about the importance of good sleep hygiene. I recommended the following at this time: sleep study.  I outlined the differences between a laboratory attended sleep study versus home sleep test. I explained the sleep test procedure to the patient and also outlined possible surgical and non-surgical treatment options of OSA, including the use of a custom-made dental device (which would require a referral to a specialist dentist or oral surgeon), upper airway surgical options, such as traditional UPPP or a novel less invasive surgical option in the form of Inspire hypoglossal nerve stimulation (which would involve a referral to an ENT surgeon). I also explained the CPAP treatment option to the patient, who indicated that he would be willing to try CPAP if the need arises. I explained the importance of being compliant with PAP treatment, not only for insurance purposes but primarily to improve His symptoms, and for the patient's long term health benefit, including to reduce His cardiovascular risks. I answered all his questions today and the patient was in agreement. I plan to see him back after the sleep study is completed and encouraged him to call with any interim questions, concerns, problems or updates.   Thank you very much for allowing me to participate in the care of this nice patient. If I can be of any further assistance to you please do not hesitate to call me at [phone].  Sincerely,   Star Age, MD, PhD

## 2021-07-06 ENCOUNTER — Ambulatory Visit (INDEPENDENT_AMBULATORY_CARE_PROVIDER_SITE_OTHER): Payer: BC Managed Care – PPO | Admitting: Family Medicine

## 2021-07-06 ENCOUNTER — Encounter (INDEPENDENT_AMBULATORY_CARE_PROVIDER_SITE_OTHER): Payer: Self-pay | Admitting: Family Medicine

## 2021-07-06 ENCOUNTER — Other Ambulatory Visit: Payer: Self-pay

## 2021-07-06 ENCOUNTER — Telehealth (INDEPENDENT_AMBULATORY_CARE_PROVIDER_SITE_OTHER): Payer: Self-pay

## 2021-07-06 VITALS — BP 144/73 | HR 78 | Temp 98.0°F | Ht 74.0 in | Wt 333.0 lb

## 2021-07-06 DIAGNOSIS — I10 Essential (primary) hypertension: Secondary | ICD-10-CM | POA: Diagnosis not present

## 2021-07-06 DIAGNOSIS — R7303 Prediabetes: Secondary | ICD-10-CM

## 2021-07-06 DIAGNOSIS — E559 Vitamin D deficiency, unspecified: Secondary | ICD-10-CM | POA: Diagnosis not present

## 2021-07-06 DIAGNOSIS — R5383 Other fatigue: Secondary | ICD-10-CM | POA: Diagnosis not present

## 2021-07-06 DIAGNOSIS — E669 Obesity, unspecified: Secondary | ICD-10-CM

## 2021-07-06 DIAGNOSIS — Z6841 Body Mass Index (BMI) 40.0 and over, adult: Secondary | ICD-10-CM

## 2021-07-06 NOTE — Progress Notes (Signed)
08:20 am Patient passed out and fell to floor at checkout immediately following blood draw. Patient denied any injuries c/o no pain. Patient was noted to be diaphoretic at time of fall B/P 60/40 P-54 O2-94% on room air. Patient c\o dizziness but denied SOB, or chest pain. Patient assisted to triage room and evaluated by Alois Cliche PA and was given crackers and water FSBS 126. Patient passed out 2 more times while being monitored at which time EMS was called. Patient was oriented x3 B/P remained low at 08:42am-91/56, 08:44-95/58. BP just before EMS arrived 08:41 BP was 105/66. Patient stated that he was feeling better but agreed to let EMS evaluate. After EMS evaluation he elected not to go to ER for further evaluation. Patient left the facility on foot no distress noted. ?

## 2021-07-06 NOTE — Progress Notes (Signed)
? ? ? ?Chief Complaint:  ? ?OBESITY ?Kevin [name] is here to discuss his progress with his obesity treatment plan along with follow-up of his obesity related diagnoses. Kevin [name] is on the Category 4 Plan and keeping a food journal and adhering to recommended goals of 450-550 calories and 35 grams of protein and states he is following his eating plan approximately 60-70% of the time. Kevin [name] states he is walking 5,000-10,000 steps 7 times per week. ? ?Today's visit was #: 8 ?Starting weight: 349 lbs ?Starting date: 01/21/2021 ?Today's weight: 333 lbs ?Today's date: 07/06/2021 ?Total lbs lost to date: 16 lbs ?Total lbs lost since last in-office visit: 0 ? ?Interim History: Kevin [name]'s biggest challenge over the last few weeks is time. Sports schedule is ramping up for his son. He recognizes he needs to get ahead of schedule and prep food or stop skipping meals. He has found a few kinds bars he likes. HE went to a [place] for his birthday.  ? ?Subjective:  ? ?1. Other fatigue ?Kevin [name] saw Dr. Rexene [name] at [facility] who recommended sleep study. He is awaiting insurance approval for study.  ? ?2. Vitamin D deficiency ?Kevin [name] denies nausea, vomiting or muscle weakness. He does note fatigue. His last level September 2022 was 26.4. ? ?3. Pre-diabetes ?Kevin [name] is currently not on medications.  ? ?4. Essential hypertension ?Kevin [name]'s blood pressure was slightly elevated today at 144/73. He denies chest pains and chest pressure. He denies headaches. He was checking his blood pressure at home but systolic measurement increased to 160 on Zestoretic.  ? ?Assessment/Plan:  ? ?1. Other fatigue ?Dain will follow up at next appointment. Saimon does feel that his weight is causing his energy to be lower than it should be. Fatigue may be related to obesity, depression or many other causes. Labs will be ordered, and in the meanwhile, Kevin [name] will focus on self care including making healthy food choices, increasing  physical activity and focusing on stress reduction.  ? ?2. Vitamin D deficiency ?Low Vitamin D level contributes to fatigue and are associated with obesity, breast, and colon cancer. We will check Vitamin D level today and Kevin [name] will follow-up for routine testing of Vitamin D, at least 2-3 times per year to avoid over-replacement. ? ?- VITAMIN D 25 Hydroxy (Vit-D Deficiency, Fractures) ? ?3. Pre-diabetes ?We will check A1C and insulin levels today. Jalean will continue to work on weight loss, exercise, and decreasing simple carbohydrates to help decrease the risk of diabetes.  ? ?- Hemoglobin A1c ?- Insulin, random ? ?4. Essential hypertension ?We will check CMP today. Kevin [name] will continue taking Zestoretic. He will follow up with blood pressure at home and he will follow up at home readings at next appointment. Kevin [name] is working on healthy weight loss and exercise to improve blood pressure control. We will watch for signs of hypotension as he continues his lifestyle modifications. ?- Comprehensive metabolic panel ? ?5. Obesity with current BMI of 44.0 ?Endi is currently in the action stage of change. As such, his goal is to continue with weight loss efforts. He has agreed to the Category 4 Plan.  ? ?Exercise goals: All adults should avoid inactivity. Some physical activity is better than none, and adults who participate in any amount of physical activity gain some health benefits. ? ?Behavioral modification strategies: increasing lean protein intake, no skipping meals, and meal planning and cooking strategies. ? ?Kevin [name] has agreed to follow-up with our clinic in 3-4 weeks. He was informed of the importance  of frequent follow-up visits to maximize his success with intensive lifestyle modifications for his multiple health conditions.  ? ?Kevin [name] was informed we would discuss his lab results at his next visit unless there is a critical issue that needs to be addressed sooner. Kevin [name] agreed to keep his next  visit at the agreed upon time to discuss these results. ? ?Objective:  ? ?Blood pressure (!) 144/73, pulse 78, temperature 98 ?F (36.7 ?C), height 6\' 2"  (1.88 m), weight (!) 333 lb (151 kg), SpO2 97 %. ?Body mass index is 42.75 kg/m?. ? ?General: Cooperative, alert, well developed, in no acute distress. ?HEENT: Conjunctivae and lids unremarkable. ?Cardiovascular: Regular rhythm.  ?Lungs: Normal work of breathing. ?Neurologic: No focal deficits.  ? ?Lab Results  ?Component Value Date  ? CREATININE 1.01 01/21/2021  ? BUN 17 01/21/2021  ? NA 141 01/21/2021  ? K 4.5 01/21/2021  ? CL 102 01/21/2021  ? CO2 22 01/21/2021  ? ?Lab Results  ?Component Value Date  ? ALT 39 01/21/2021  ? AST 30 01/21/2021  ? ALKPHOS 104 01/21/2021  ? BILITOT 0.4 01/21/2021  ? ?Lab Results  ?Component Value Date  ? HGBA1C 6.2 (H) 01/21/2021  ? ?Lab Results  ?Component Value Date  ? INSULIN 30.7 (H) 01/21/2021  ? ?Lab Results  ?Component Value Date  ? TSH 2.650 01/21/2021  ? ?Lab Results  ?Component Value Date  ? CHOL 148 01/21/2021  ? HDL 49 01/21/2021  ? [place] 79 01/21/2021  ? TRIG 110 01/21/2021  ? ?Lab Results  ?Component Value Date  ? VD25OH 26.4 (L) 01/21/2021  ? ?Lab Results  ?Component Value Date  ? WBC 4.8 01/21/2021  ? HGB 14.9 01/21/2021  ? HCT 44.5 01/21/2021  ? MCV 91 01/21/2021  ? PLT 239 01/21/2021  ? ?No results found for: IRON, TIBC, FERRITIN ? ?Attestation Statements:  ? ?Reviewed by clinician on day of visit: allergies, medications, problem list, medical history, surgical history, family history, social history, and previous encounter notes. ? ?I, Lizbeth Bark, RMA, am acting as transcriptionist for Coralie Common, MD ?I have reviewed the above documentation for accuracy and completeness, and I agree with the above. Coralie Common, MD ? ?

## 2021-07-06 NOTE — Telephone Encounter (Signed)
Attempted to contact pt to check up on him after fall in office this morning. Pt did not answer. LMOM for pt to return call. ?

## 2021-07-07 ENCOUNTER — Telehealth (INDEPENDENT_AMBULATORY_CARE_PROVIDER_SITE_OTHER): Payer: Self-pay

## 2021-07-07 LAB — COMPREHENSIVE METABOLIC PANEL
ALT: 32 IU/L (ref 0–44)
AST: 23 IU/L (ref 0–40)
Albumin/Globulin Ratio: 1.9 (ref 1.2–2.2)
Albumin: 4.7 g/dL (ref 4.0–5.0)
Alkaline Phosphatase: 97 IU/L (ref 44–121)
BUN/Creatinine Ratio: 11 (ref 9–20)
BUN: 11 mg/dL (ref 6–24)
Bilirubin Total: 0.8 mg/dL (ref 0.0–1.2)
CO2: 24 mmol/L (ref 20–29)
Calcium: 9.6 mg/dL (ref 8.7–10.2)
Chloride: 100 mmol/L (ref 96–106)
Creatinine, Ser: 1.01 mg/dL (ref 0.76–1.27)
Globulin, Total: 2.5 g/dL (ref 1.5–4.5)
Glucose: 124 mg/dL — ABNORMAL HIGH (ref 70–99)
Potassium: 3.8 mmol/L (ref 3.5–5.2)
Sodium: 139 mmol/L (ref 134–144)
Total Protein: 7.2 g/dL (ref 6.0–8.5)
eGFR: 91 mL/min/{1.73_m2} (ref >59)

## 2021-07-07 LAB — HEMOGLOBIN A1C
Est. average glucose Bld gHb Est-mCnc: 128 mg/dL
Hgb A1c MFr Bld: 6.1 % — ABNORMAL HIGH (ref 4.8–5.6)

## 2021-07-07 LAB — VITAMIN D 25 HYDROXY (VIT D DEFICIENCY, FRACTURES): Vit D, 25-Hydroxy: 39.9 ng/mL (ref 30.0–100.0)

## 2021-07-07 LAB — INSULIN, RANDOM: INSULIN: 25.8 u[IU]/mL — ABNORMAL HIGH (ref 2.6–24.9)

## 2021-07-07 NOTE — Telephone Encounter (Signed)
Contacted pt to check on how he is doing after his fall in the office yesterday. Pt stated that he is doing ok, a little sore on his left side but doesn't feel like he needs medical attention. Pt denies any further episodes of syncope and has monitored his B/P more closely. Pt states that his B/P has returned to his normal and has remained there. Pt was advised to reach out to [place] if needed. Nothing further needed at this time. ? ?Maryjo Ragon LPN ?

## 2021-07-15 ENCOUNTER — Institutional Professional Consult (permissible substitution): Payer: BC Managed Care – PPO | Admitting: Neurology

## 2021-07-30 ENCOUNTER — Ambulatory Visit (INDEPENDENT_AMBULATORY_CARE_PROVIDER_SITE_OTHER): Payer: BC Managed Care – PPO | Admitting: Family Medicine

## 2021-07-30 ENCOUNTER — Encounter (INDEPENDENT_AMBULATORY_CARE_PROVIDER_SITE_OTHER): Payer: Self-pay | Admitting: Family Medicine

## 2021-07-30 VITALS — BP 108/71 | HR 82 | Temp 97.7°F | Ht 74.0 in | Wt 333.0 lb

## 2021-07-30 DIAGNOSIS — Z6841 Body Mass Index (BMI) 40.0 and over, adult: Secondary | ICD-10-CM | POA: Diagnosis not present

## 2021-07-30 DIAGNOSIS — E559 Vitamin D deficiency, unspecified: Secondary | ICD-10-CM

## 2021-07-30 DIAGNOSIS — E669 Obesity, unspecified: Secondary | ICD-10-CM | POA: Diagnosis not present

## 2021-07-30 DIAGNOSIS — R7303 Prediabetes: Secondary | ICD-10-CM

## 2021-07-30 MED ORDER — VITAMIN D (ERGOCALCIFEROL) 1.25 MG (50000 UNIT) PO CAPS: 50000.0000 [IU] | ORAL_CAPSULE | ORAL | 0 refills | Status: DC

## 2021-08-04 ENCOUNTER — Ambulatory Visit (INDEPENDENT_AMBULATORY_CARE_PROVIDER_SITE_OTHER): Payer: BC Managed Care – PPO | Admitting: Neurology

## 2021-08-04 DIAGNOSIS — R0683 Snoring: Secondary | ICD-10-CM

## 2021-08-04 DIAGNOSIS — G4719 Other hypersomnia: Secondary | ICD-10-CM

## 2021-08-04 DIAGNOSIS — G4720 Circadian rhythm sleep disorder, unspecified type: Secondary | ICD-10-CM

## 2021-08-04 DIAGNOSIS — G4733 Obstructive sleep apnea (adult) (pediatric): Secondary | ICD-10-CM | POA: Diagnosis not present

## 2021-08-04 DIAGNOSIS — R351 Nocturia: Secondary | ICD-10-CM

## 2021-08-04 DIAGNOSIS — Z820 Family history of epilepsy and other diseases of the nervous system: Secondary | ICD-10-CM

## 2021-08-04 DIAGNOSIS — R0681 Apnea, not elsewhere classified: Secondary | ICD-10-CM

## 2021-08-04 NOTE — Progress Notes (Signed)
? ? ? ?Chief Complaint:  ? ?OBESITY ?Kevin [name] is here to discuss his progress with his obesity treatment plan along with follow-up of his obesity related diagnoses. Mariusz is on the Category 4 Plan and states he is following his eating plan approximately 60% of the time. Endy states he is walking for 30 minutes 2-3 times per week, and hiking 7 miles 1 time per week.   ? ?Today's visit was #: 9 ?Starting weight: 349 lbs ?Starting date: 01/21/2021 ?Today's weight: 333 lbs  ?Today's date: 07/30/2021 ?Total lbs lost to date: 16 ?Total lbs lost since last in-office visit: 0 ? ?Interim History: [name] has been trying to follow the plan as strictly as she can, but he is limited in time. He is going to the [employer] today with one of his sons. E is hoping to have a bit more time to start meal planning this next weekend. No other upcoming plans aside from this weekend trip.  ? ?Subjective:  ? ?1. Pre-diabetes ?Kevin [name]'s A1c has improved from 6.2 to 6.1, and insulin of 30.7 to 25.8. He has been substituting increased protein snacks for tradition increased carbohydrates snacks.  ? ?2. Vitamin D deficiency ?Kevin [name]'s last Vit D level was of 39.9 (improved from 26.4). She is on prescription Vitamin D. I discussed labs with the patient today.  ? ?Assessment/Plan:  ? ?1. Pre-diabetes ?Kevin [name] will continue his Category 4 plan with no changed needed. We will repeat labs in 3 months.  ? ?2. Vitamin D deficiency ?We will refill Vitamin D 50,000 IU weekly for 1 month. We will recheck labs in 3 months. ? ?- Vitamin D, Ergocalciferol, (DRISDOL) 1.25 MG (50000 UNIT) CAPS capsule; Take 1 capsule (50,000 Units total) by mouth every 7 (seven) days.  Dispense: 4 capsule; Refill: 0 ? ?3. Obesity with current BMI of 42.8 ?Kevin [name] is currently in the action stage of change. As such, his goal is to continue with weight loss efforts. He has agreed to the Category 4 Plan.  ? ?Exercise goals: All adults should avoid inactivity. Some physical  activity is better than none, and adults who participate in any amount of physical activity gain some health benefits. ? ?Behavioral modification strategies: increasing lean protein intake, no skipping meals, meal planning and cooking strategies, keeping healthy foods in the home, and travel eating strategies. ? ?[name] has agreed to follow-up with our clinic in 3 to 4 weeks. He was informed of the importance of frequent follow-up visits to maximize his success with intensive lifestyle modifications for his multiple health conditions.  ? ?Objective:  ? ?Blood pressure 108/71, pulse 82, temperature 97.7 ?F (36.5 ?C), height 6\' 2"  (1.88 m), weight (!) 333 lb (151 kg), SpO2 97 %. ?Body mass index is 42.75 kg/m?. ? ?General: Cooperative, alert, well developed, in no acute distress. ?HEENT: Conjunctivae and lids unremarkable. ?Cardiovascular: Regular rhythm.  ?Lungs: Normal work of breathing. ?Neurologic: No focal deficits.  ? ?Lab Results  ?Component Value Date  ? CREATININE 1.01 07/06/2021  ? BUN 11 07/06/2021  ? NA 139 07/06/2021  ? K 3.8 07/06/2021  ? CL 100 07/06/2021  ? CO2 24 07/06/2021  ? ?Lab Results  ?Component Value Date  ? ALT 32 07/06/2021  ? AST 23 07/06/2021  ? ALKPHOS 97 07/06/2021  ? BILITOT 0.8 07/06/2021  ? ?Lab Results  ?Component Value Date  ? HGBA1C 6.1 (H) 07/06/2021  ? HGBA1C 6.2 (H) 01/21/2021  ? ?Lab Results  ?Component Value Date  ? INSULIN 25.8 (H) 07/06/2021  ?  INSULIN 30.7 (H) 01/21/2021  ? ?Lab Results  ?Component Value Date  ? TSH 2.650 01/21/2021  ? ?Lab Results  ?Component Value Date  ? CHOL 148 01/21/2021  ? HDL 49 01/21/2021  ? LDLCALC 79 01/21/2021  ? TRIG 110 01/21/2021  ? ?Lab Results  ?Component Value Date  ? VD25OH 39.9 07/06/2021  ? VD25OH 26.4 (L) 01/21/2021  ? ?Lab Results  ?Component Value Date  ? WBC 4.8 01/21/2021  ? HGB 14.9 01/21/2021  ? HCT 44.5 01/21/2021  ? MCV 91 01/21/2021  ? PLT 239 01/21/2021  ? ?No results found for: IRON, TIBC, FERRITIN ? ?Attestation Statements:   ? ?Reviewed by clinician on day of visit: allergies, medications, problem list, medical history, surgical history, family history, social history, and previous encounter notes. ? ? ?I, Burt Knack, am acting as transcriptionist for Reuben Likes, MD. ?I have reviewed the above documentation for accuracy and completeness, and I agree with the above. Reuben Likes, MD ? ? ?

## 2021-08-14 NOTE — Addendum Note (Signed)
Addended by: Huston Foley on: 08/14/2021 12:58 PM ? ? Modules accepted: Orders ? ?

## 2021-08-14 NOTE — Procedures (Signed)
PATIENT'S NAME:  Kevin [name], Kevin [name] ?DOB:      Oct 11, 1970      ?MR#:    [id number]     ?DATE OF RECORDING: 08/04/2021 ?REFERRING M.D.:  Dibas Docia Chuck, MD ?Study Performed:  Split-Night Titration Study ?HISTORY: 51 year old man with a history of hypertension, hyperlipidemia, fatty liver, shortness of breath, and severe obesity with a BMI of over 40, who reports snoring and excessive daytime somnolence, as well as witnessed apneas per wife's report. The patient endorsed the Epworth Sleepiness Scale at 6 points. The patient's weight 342 pounds with a height of 74 (inches), resulting in a BMI of 43.9 kg/m2. The patient's neck circumference measured 19.9 inches. ? ?CURRENT MEDICATIONS: Atorvastatin Calcium, Flonase, Xyzal, Zestoretic, Drisdol.  ?  ?PROCEDURE:  This is a multichannel digital polysomnogram utilizing the Somnostar 11.2 system.  Electrodes and sensors were applied and monitored per AASM Specifications.   EEG, EOG, Chin and Limb EMG, were sampled at 200 Hz.  ECG, Snore and Nasal Pressure, Thermal Airflow, Respiratory Effort, CPAP Flow and Pressure, Oximetry was sampled at 50 Hz. Digital video and audio were recorded.     ? ?BASELINE STUDY WITHOUT CPAP RESULTS: ? ?Lights Out was at 20:31 and Lights On at 05:00 for the night, split start at 00:46, epoch 519. He qualified for emergency split due to severe sleep apnea.  Total recording time (TRT) was 265, with a total sleep time (TST) of 175 minutes.   The patient's sleep latency was 70 minutes, which is delayed. REM latency was 65 minutes.  The sleep efficiency was 66%, which is reduced.  ?  ?SLEEP ARCHITECTURE: WASO (Wake after sleep onset) was 7.5 minutes, Stage N1 was 4.5 minutes, Stage N2 was 89 minutes, Stage N3 was 43.5 minutes and Stage R (REM sleep) was 38 minutes.  The percentages were Stage N1 2.6%, Stage N2 50.9%, Stage N3 24.9% and Stage R (REM sleep) 21.7%.  ?The arousals were noted as: 12 were spontaneous, 0 were associated with PLMs, 12 were associated  with respiratory events. ? ?RESPIRATORY ANALYSIS:  There were a total of 157 respiratory events:  47 obstructive apneas, 0 central apneas and 0 mixed apneas with a total of 47 apneas and an apnea index (AI) of 16.1. There were 110 hypopneas with a hypopnea index of 37.7. The patient also had 0 respiratory event related arousals (RERAs).  ?Snoring was noted. ?    ?The total APNEA/HYPOPNEA INDEX (AHI) was 53.8 /hour and the total RESPIRATORY DISTURBANCE INDEX was 53.8 /hour.  44 events occurred in REM sleep and 194 events in NREM. The REM AHI was 69.5, /hour versus a non-REM AHI of 49.5 /hour. The patient spent 246.5 minutes sleep time in the supine position 83 minutes in non-supine. The supine AHI was 73.7 /hour versus a non-supine AHI of 31.8 /hour. ? ?OXYGEN SATURATION & C02:  The wake baseline 02 saturation was 94%, with the lowest being 66% during supine REM sleep. Time spent below 89% saturation equaled 35 minutes. ? ? ?PERIODIC LIMB MOVEMENTS: The patient had a total of 24 Periodic Limb Movements.  The Periodic Limb Movement (PLM) index was 8.2 /hour and the PLM Arousal index was 0 /hour. ? ?Audio and video analysis did not show any abnormal or unusual movements, behaviors, phonations or vocalizations. The patient took 1 bathroom break for the night. Mild to moderate snoring was noted. The EKG was in keeping with normal sinus rhythm (NSR). ? ?TITRATION STUDY WITH CPAP RESULTS: ?  ?The patient was fitted with  a large [place] FFM from [employer]. CPAP was initiated at 7 cmH20 with heated humidity per AASM split standards and pressure was gradually advanced to 16 cmH20 because of hypopneas, apneas and desaturations.  At a pressure of 16 cmH20, there was a reduction of the AHI to 15.8/hour, O2 nadir of 85% with supine NREM sleep achieved.   ? ?Total recording time (TRT) was 244.5 minutes, with a total sleep time (TST) of 154.5 minutes. The patient's sleep latency was 74 minutes, which is delayed. REM latency was 146  minutes.  The sleep efficiency was 63.2 %, which is reduced.   ? ?SLEEP ARCHITECTURE: Wake after sleep was 51.5 minutes with moderate sleep fragmentation noted. Stage N1 10.5 minutes, Stage N2 137 minutes, Stage N3 0 minutes and Stage R (REM sleep) 7 minutes. The percentages were: Stage N1 6.8%, Stage N2 88.7%, Stage N3 was absent, and Stage R (REM sleep) 4.5%, which is markedly reduced. The arousals were noted as: 33 were spontaneous, 0 were associated with PLMs, 42 were associated with respiratory events. ? ?RESPIRATORY ANALYSIS:  There were a total of 97 respiratory events: 18 obstructive apneas, 4 central apneas and 0 mixed apneas with a total of 22 apneas and an apnea index (AI) of 8.5. There were 75 hypopneas with a hypopnea index of 29.1 /hour. The patient also had 0 respiratory event related arousals (RERAs).     ? ?The total APNEA/HYPOPNEA INDEX  (AHI) was 37.7 /hour and the total RESPIRATORY DISTURBANCE INDEX was 37.7 /hour.  0 events occurred in REM sleep and 97 events in NREM. The REM AHI was 0 /hour versus a non-REM AHI of 39.5 /hour. REM sleep was achieved on a pressure of  cm/h2o (AHI was  .) The patient spent 100% of total sleep time in the supine position. The supine AHI was 37.6 /hour, versus a non-supine AHI of 0.0/hour. ? ?OXYGEN SATURATION & C02:  The wake baseline 02 saturation was 96%, with the lowest being 83%. Time spent below 89% saturation equaled 23 minutes. ? ?PERIODIC LIMB MOVEMENTS:    ?The patient had a total of 0 Periodic Limb Movements. The Periodic Limb Movement (PLM) index was 0 /hour and the PLM Arousal index was 0 /hour. ? ?Post-study, the patient indicated that sleep was the same as usual. ? ?POLYSOMNOGRAPHY IMPRESSION :  ? ?Severe Obstructive Sleep Apnea (OSA)  ?Dysfunctions associated with sleep stages or arousals from sleep ? ?RECOMMENDATIONS: ? ?This patient severe obstructive sleep apnea and qualified for an emergency split study with a baseline total AHI of 53.8/hour, O2  nadir of 66% (during supine REM sleep). He responded quite well to CPAP therapy, but required higher pressures. Due to a reduced sleep efficiency, hence, lack of time for an optimal titration, he had residual sleep disordered breathing on a final treatment pressure of 16 cm of CPAP with a residual AHI of 15.8/hour, O2 nadir of 85% during supine NREM sleep. I will recommend, that the patient start home BiPAP treatment at a pressure of 18/14 cm via large FFM of choice or mask of choice, sized to fit. He may benefit from returning for a designated titration study and may need BiPAP therapy at a higher pressure even. The patient will be reminded to be fully compliant with PAP therapy to improve sleep related symptoms and decrease long term cardiovascular risks. Please note that untreated obstructive sleep apnea may carry additional perioperative morbidity. Patients with significant obstructive sleep apnea should receive perioperative PAP therapy and the surgeons and  particularly the anesthesiologist should be informed of the diagnosis and the severity of the sleep disordered breathing. ?This study shows sleep fragmentation and abnormal sleep stage percentages; these are nonspecific findings and per se do not signify an intrinsic sleep disorder or a cause for the patient's sleep-related symptoms. Causes include (but are not limited to) the first night effect of the sleep study, circadian rhythm disturbances, medication effect or an underlying mood disorder or medical problem.  ?The patient should be cautioned not to drive, work at heights, or operate dangerous or heavy equipment when tired or sleepy. Review and reiteration of good sleep hygiene measures should be pursued with any patient. ?The patient will be seen in follow-up in the sleep clinic at [facility] for discussion of the test results, symptom and treatment compliance review, further management strategies, etc. The referring provider will be notified of the test  results. ? ?I certify that I have reviewed the entire raw data recording prior to the issuance of this report in accordance with the Standards of Accreditation of the American Academy of Sleep Medicine (AASM) ? ?S

## 2021-08-18 ENCOUNTER — Telehealth: Payer: Self-pay | Admitting: *Deleted

## 2021-08-18 DIAGNOSIS — G4733 Obstructive sleep apnea (adult) (pediatric): Secondary | ICD-10-CM

## 2021-08-18 NOTE — Telephone Encounter (Signed)
-----   Message from Huston Foley, MD sent at 08/14/2021 12:58 PM EDT ----- ?Patient referred by Dr. Lawson Radar, seen by me on 07/01/21, split night sleep study on 08/04/21 (he qualified for emergency split d/t severe sleep apnea). ?Please call and notify patient that the recent sleep study showed severe obstructive sleep apnea, aka OSA. He did fairly well with CPAP, but required a relatively high pressure and we essentially ran out of time to fully/optimally treat him during this study. He may benefit from returning for a full night study for treatment, but for now, I would like to start him on a machine called BiPAP at home by prescribing a machine for home use. I placed the order in the chart.  ?Please advise patient that we will need a follow up appointment with either myself or one of our nurse practitioners in about 2-3 months post set-up to check for how they are doing on treatment and how well it's going with the machine in general. Most insurance company require a certain compliance percentage to continue to cover/pay for the machine. Please ask patient to schedule this FU appointment, according to the set-up date, which is the day they receive the machine. Please make sure, the patient understands the importance of keeping this window for the FU appointment, as it is often an Barista and not our rule. Failing to adhere to this may result in losing coverage for sleep apnea treatment, at which point some insurances require repeating the whole process. Plus, monitoring compliance data is usually good feedback for the patient as far as how they are doing, how many hours they are on it, how well the mask fits, etc.  ?Also remind patient, that any PAP machine or mask issues should be first addressed with the DME company, who provided the machine/supplies.  ?Please ask if patient has a preference regarding DME company, may depend on the insurance too. ? ?Huston Foley, MD, PhD ?[facility])   ? ? ?

## 2021-08-18 NOTE — Telephone Encounter (Signed)
Spoke with patient and discussed his sleep study results.  Patient verbalized understanding and he is amenable to trying BiPAP at home.  He understands insurance compliance requirements which includes using the machine for at least 4 hours at night and also being seen in the clinic between 60 and 90 days after machine set up.  Patient was concerned about traveling.  He understands he should be able to travel with the machine, may be a little cumbersome.  He will be traveling internationally from approx 6/20-7/3.  Patient has [employer] and is located in [place].  Discussed DME company, will use [facility].  Patient aware he will receive a call to schedule set up.  His questions were answered during the call he verbalized appreciation. ? ?Order, office note, insurance info, sleep study result all faxed to [facility]. Received a receipt of confirmation. F/u letter sent to pt via mychart.  ?

## 2021-08-20 NOTE — Telephone Encounter (Signed)
Received fax from [facility] stating they are missing a titration study that shows CPAP was ineffective in treatment the apnea but BiPAP was effective. ?

## 2021-08-20 NOTE — Telephone Encounter (Signed)
Spoke with patient and provided update to plan of care.  His questions were answered.  I also moved up his initial compliance appointment to Monday, July 10 at 7:45 AM and canceled the morning for the end of July.  He was encouraged to call his DME company if he has any questions once he starts his CPAP in regards to pressure, etc.  They can troubleshoot first.  Our office can always review a download for potential changes to prescription if needed before his compliance appointment.  Patient verbalized appreciation for the call. ?

## 2021-08-20 NOTE — Addendum Note (Signed)
Addended by: Bertram Savin on: 08/20/2021 02:02 PM ? ? Modules accepted: Orders ? ?

## 2021-08-20 NOTE — Telephone Encounter (Signed)
I spoke with [facility] @ [facility].  He understands an emergency split study was completed with no time to titrate on BiPAP.  He stated patient's insurance will not allow patient to start BiPAP without showing failure of CPAP and effective BiPAP treatment on titration.  I advised an order will be written for CPAP per Dr. Frances Furbish.  He states there may be a chance that patient's insurance would allow a switch to BiPAP after 30 days if patient fails CPAP and can be considered an "at home failure" if settings are not adequate and mask is not an issue.  This can be reevaluated later at his follow-up.  Patient may need a formal titration study in the future. ? ?Order placed per Dr. Frances Furbish CPAP of 16 cm and an EPR of 3. Order printed and faxed to [facility]. Received a receipt of confirmation. ? ?

## 2021-08-20 NOTE — Telephone Encounter (Signed)
Please ask the DME to review the split study reports in DETAIL, he did not have a BiPAP trial as he qualified for emergency split due to severe sleep apnea, and we did not have time to titrate on BiPAP, I would have liked to start him on EMPIRIC BiPAP. If this is not covered by the insurance then we can start him on CPAP of 16 cm. Please change order after talking to the DME. We may have to bring him in for a formal titration study separately in the future.   ?

## 2021-08-24 ENCOUNTER — Institutional Professional Consult (permissible substitution): Payer: BC Managed Care – PPO | Admitting: Neurology

## 2021-08-25 ENCOUNTER — Ambulatory Visit (INDEPENDENT_AMBULATORY_CARE_PROVIDER_SITE_OTHER): Payer: BC Managed Care – PPO | Admitting: Family Medicine

## 2021-08-25 ENCOUNTER — Encounter (INDEPENDENT_AMBULATORY_CARE_PROVIDER_SITE_OTHER): Payer: Self-pay | Admitting: Family Medicine

## 2021-08-25 VITALS — BP 124/82 | HR 78 | Temp 97.6°F | Ht 74.0 in | Wt 328.0 lb

## 2021-08-25 DIAGNOSIS — G4733 Obstructive sleep apnea (adult) (pediatric): Secondary | ICD-10-CM | POA: Diagnosis not present

## 2021-08-25 DIAGNOSIS — Z6841 Body Mass Index (BMI) 40.0 and over, adult: Secondary | ICD-10-CM | POA: Diagnosis not present

## 2021-08-25 DIAGNOSIS — E669 Obesity, unspecified: Secondary | ICD-10-CM | POA: Diagnosis not present

## 2021-08-25 DIAGNOSIS — E559 Vitamin D deficiency, unspecified: Secondary | ICD-10-CM

## 2021-08-25 DIAGNOSIS — Z9189 Other specified personal risk factors, not elsewhere classified: Secondary | ICD-10-CM

## 2021-08-25 MED ORDER — VITAMIN D (ERGOCALCIFEROL) 1.25 MG (50000 UNIT) PO CAPS: 50000.0000 [IU] | ORAL_CAPSULE | ORAL | 0 refills | Status: DC

## 2021-08-28 DIAGNOSIS — G4733 Obstructive sleep apnea (adult) (pediatric): Secondary | ICD-10-CM | POA: Diagnosis not present

## 2021-08-31 NOTE — Telephone Encounter (Signed)
DME: [facility] ?Ph: [phone] ?Fax: [phone] ?[facility] 10  ?Setup 08/28/21 (appt needed 09/28/21-11/27/21)  ? ?Received fax from [facility] notifying us of Setup date 08/28/21. ?Pt's current initial f/u is scheduled for 11/09/21. ?

## 2021-09-02 NOTE — Progress Notes (Signed)
Chief Complaint:   OBESITY Kevin [name] is here to discuss his progress with his obesity treatment plan along with follow-up of his obesity related diagnoses. Kevin [name] is on the Category 4 Plan and states he is following his eating plan approximately 60% of the time. Kevin [name] states he is walking 30 minutes 2 times per week.  Today's visit was #: 10 Starting weight: 349 lbs Starting date: 01/21/2021 Today's weight: 328 lbs Today's date: 08/25/21 Total lbs lost to date: 21 Total lbs lost since last in-office visit: 0  Interim History: Kevin [name] and his wife are significantly feeling the adverse effects of allergies. He has been taking Flonase and Xyzal consistently. He is planning on a trip to [place] at the end of June for 10 days with hikes and bike rides. His wife was really helping him with meal plan but has been ill with allergies and so last week has not been as on as previously.   Subjective:   1. Vitamin D deficiency Kevin [name] is currently taking prescription Vit D. His last Vit D level was of 39.9.Denies any nausea, vomiting or muscle weakness.  2. OSA (obstructive sleep apnea) Kevin [name] had a sleep study showing severe OSA and BiPap recommended but insurance would not cover completely but would cover CPAP.  3. At risk for osteoporosis Kevin [name] is at higher risk of osteopenia and osteoporosis due to Vitamin D deficiency.   Assessment/Plan:   1. Vitamin D deficiency Low Vitamin D level contributes to fatigue and are associated with obesity, breast, and colon cancer. He agrees to continue to take prescription Vitamin D @50 ,000 IU every week and will follow-up for routine testing of Vitamin D, at least 2-3 times per year to avoid over-replacement. We will refill Vit D @50 ,000 IU every week for 1 month with no refills.  - Refill Vitamin D, Ergocalciferol, (DRISDOL) 1.25 MG (50000 UNIT) CAPS capsule; Take 1 capsule (50,000 Units total) by mouth every 7 (seven) days.  Dispense: 4 capsule;  Refill: 0  2. OSA (obstructive sleep apnea) Kevin [name] will follow up with machine acquisition and sleep efficiency improvement.  3. At risk for osteoporosis Kevin [name] was given approximately 15 minutes of osteoporosis prevention counseling today. Kevin [name] is at risk for osteopenia and osteoporosis due to his Vitamin D deficiency. He was encouraged to take his Vit D and follow his higher calcium diet and increase strengthening exercise to help strengthen his bones and decrease his risk of osteopenia and osteoporosis.   4. Obesity with current BMI of 42.1 Kevin [name] is currently in the action stage of change. As such, his goal is to continue with weight loss efforts. He has agreed to the Category 4 Plan.   Exercise goals: All adults should avoid inactivity. Some physical activity is better than none, and adults who participate in any amount of physical activity gain some health benefits. Kevin [name] is to start increased exercise regimen.  Behavioral modification strategies: increasing lean protein intake and meal planning and cooking strategies.  Kevin [name] has agreed to follow-up with our clinic in 4 weeks. He was informed of the importance of frequent follow-up visits to maximize his success with intensive lifestyle modifications for his multiple health conditions.   Objective:   Blood pressure 124/82, pulse 78, temperature 97.6 F (36.4 C), height 6\' 2"  (1.88 m), weight (!) 328 lb (148.8 kg), SpO2 98 %. Body mass index is 42.11 kg/m.  General: Cooperative, alert, well developed, in no acute distress. HEENT: Conjunctivae and lids unremarkable. Cardiovascular: Regular rhythm.  Lungs:  Normal work of breathing. Neurologic: No focal deficits.   Lab Results  Component Value Date   CREATININE 1.01 07/06/2021   BUN 11 07/06/2021   NA 139 07/06/2021   K 3.8 07/06/2021   CL 100 07/06/2021   CO2 24 07/06/2021   Lab Results  Component Value Date   ALT 32 07/06/2021   AST 23 07/06/2021   ALKPHOS 97  07/06/2021   BILITOT 0.8 07/06/2021   Lab Results  Component Value Date   HGBA1C 6.1 (H) 07/06/2021   HGBA1C 6.2 (H) 01/21/2021   Lab Results  Component Value Date   INSULIN 25.8 (H) 07/06/2021   INSULIN 30.7 (H) 01/21/2021   Lab Results  Component Value Date   TSH 2.650 01/21/2021   Lab Results  Component Value Date   CHOL 148 01/21/2021   HDL 49 01/21/2021   LDLCALC 79 01/21/2021   TRIG 110 01/21/2021   Lab Results  Component Value Date   VD25OH 39.9 07/06/2021   VD25OH 26.4 (L) 01/21/2021   Lab Results  Component Value Date   WBC 4.8 01/21/2021   HGB 14.9 01/21/2021   HCT 44.5 01/21/2021   MCV 91 01/21/2021   PLT 239 01/21/2021   No results found for: IRON, TIBC, FERRITIN  Attestation Statements:   Reviewed by clinician on day of visit: allergies, medications, problem list, medical history, surgical history, family history, social history, and previous encounter notes.  I, Fortino Sic, RMA am acting as transcriptionist for Reuben Likes, MD.  I have reviewed the above documentation for accuracy and completeness, and I agree with the above. - Reuben Likes, MD

## 2021-09-10 DIAGNOSIS — L718 Other rosacea: Secondary | ICD-10-CM | POA: Diagnosis not present

## 2021-09-23 ENCOUNTER — Encounter (INDEPENDENT_AMBULATORY_CARE_PROVIDER_SITE_OTHER): Payer: Self-pay

## 2021-09-23 ENCOUNTER — Ambulatory Visit (INDEPENDENT_AMBULATORY_CARE_PROVIDER_SITE_OTHER): Payer: BC Managed Care – PPO | Admitting: Family Medicine

## 2021-09-24 ENCOUNTER — Encounter (INDEPENDENT_AMBULATORY_CARE_PROVIDER_SITE_OTHER): Payer: Self-pay | Admitting: Family Medicine

## 2021-09-24 ENCOUNTER — Ambulatory Visit (INDEPENDENT_AMBULATORY_CARE_PROVIDER_SITE_OTHER): Payer: BC Managed Care – PPO | Admitting: Family Medicine

## 2021-09-24 VITALS — BP 113/69 | HR 70 | Temp 98.0°F | Ht 74.0 in | Wt 333.0 lb

## 2021-09-24 DIAGNOSIS — E669 Obesity, unspecified: Secondary | ICD-10-CM | POA: Diagnosis not present

## 2021-09-24 DIAGNOSIS — Z6841 Body Mass Index (BMI) 40.0 and over, adult: Secondary | ICD-10-CM

## 2021-09-24 DIAGNOSIS — R7303 Prediabetes: Secondary | ICD-10-CM

## 2021-09-24 DIAGNOSIS — E559 Vitamin D deficiency, unspecified: Secondary | ICD-10-CM

## 2021-09-24 DIAGNOSIS — Z9189 Other specified personal risk factors, not elsewhere classified: Secondary | ICD-10-CM

## 2021-09-24 MED ORDER — METFORMIN HCL 500 MG PO TABS: 500.0000 mg | ORAL_TABLET | Freq: Every day | ORAL | 0 refills | Status: DC

## 2021-09-24 MED ORDER — VITAMIN D (ERGOCALCIFEROL) 1.25 MG (50000 UNIT) PO CAPS: 50000.0000 [IU] | ORAL_CAPSULE | ORAL | 0 refills | Status: DC

## 2021-09-27 DIAGNOSIS — G4733 Obstructive sleep apnea (adult) (pediatric): Secondary | ICD-10-CM | POA: Diagnosis not present

## 2021-10-06 NOTE — Progress Notes (Signed)
Chief Complaint:   OBESITY Kevin [name] is here to discuss his progress with his obesity treatment plan along with follow-up of his obesity related diagnoses. Kevin [name] is on the Category 4 Plan and states he is following his eating plan approximately 70% of the time. Kevin [name] states he is walking for 30 minutes 3 times per week.  Today's visit was #: 11 Starting weight: 349 lbs Starting date: 01/21/2021 Today's weight: 333 lbs Today's date: 09/24/2021 Total lbs lost to date: 16 Total lbs lost since last in-office visit: 0  Interim History: Kevin [name] has gotten off track recently with his wife being sick, and doing more grab and go eating. Most of his weight gain appears to be water weight. He is open to looking at other options to help him get back on track.  Subjective:   1. Vitamin D deficiency Kevin [name] is on Vitamin D, but his level is not yet at goal. He requests a refill today.   2. Pre-diabetes Kevin [name]'s last A1c, fasting glucose, and fasting insulin were elevated. He is working on his diet and exercise, but he is struggling a bit more recently. I discussed labs with the patient today.   3. At risk for diabetes mellitus Kevin [name] is at higher than average risk for developing diabetes due to his obesity.  Assessment/Plan:   1. Vitamin D deficiency We will refill prescription Vitamin D for 1 month. Kaycen will follow-up for routine testing of Vitamin D, at least 2-3 times per year to avoid over-replacement.  - Vitamin D, Ergocalciferol, (DRISDOL) 1.25 MG (50000 UNIT) CAPS capsule; Take 1 capsule (50,000 Units total) by mouth every 7 (seven) days.  Dispense: 4 capsule; Refill: 0  2. Pre-diabetes Kevin [name] agreed to start metformin 500 mg q AM with no refills. He will continue to work on his diet, exercise, and decreasing simple carbohydrates to help decrease the risk of diabetes.   - metFORMIN (GLUCOPHAGE) 500 MG tablet; Take 1 tablet (500 mg total) by mouth daily with breakfast.   Dispense: 30 tablet; Refill: 0  3. At risk for diabetes mellitus Kevin [name] was given approximately 15 minutes of diabetic education and counseling today. We discussed intensive lifestyle modifications today with an emphasis on weight loss as well as increasing exercise and decreasing simple carbohydrates in his diet. We also reviewed medication options with an emphasis on risk versus benefits of those discussed.  Repetitive spaced learning was employed today to elicit superior memory formation and behavioral change.  4. Obesity, Current BMI 42.8 Kevin [name] is currently in the action stage of change. As such, his goal is to continue with weight loss efforts. He has agreed to change to following a lower carbohydrate, vegetable and lean protein rich diet plan.   Exercise goals: As is.   Behavioral modification strategies: increasing lean protein intake and meal planning and cooking strategies.  Kevin [name] has agreed to follow-up with our clinic in 3 weeks. He was informed of the importance of frequent follow-up visits to maximize his success with intensive lifestyle modifications for his multiple health conditions.   Objective:   Blood pressure 113/69, pulse 70, temperature 98 F (36.7 C), height 6\' 2"  (1.88 m), weight (!) 333 lb (151 kg), SpO2 97 %. Body mass index is 42.75 kg/m.  General: Cooperative, alert, well developed, in no acute distress. HEENT: Conjunctivae and lids unremarkable. Cardiovascular: Regular rhythm.  Lungs: Normal work of breathing. Neurologic: No focal deficits.   Lab Results  Component Value Date   CREATININE 1.01 07/06/2021  BUN 11 07/06/2021   NA 139 07/06/2021   K 3.8 07/06/2021   CL 100 07/06/2021   CO2 24 07/06/2021   Lab Results  Component Value Date   ALT 32 07/06/2021   AST 23 07/06/2021   ALKPHOS 97 07/06/2021   BILITOT 0.8 07/06/2021   Lab Results  Component Value Date   HGBA1C 6.1 (H) 07/06/2021   HGBA1C 6.2 (H) 01/21/2021   Lab Results   Component Value Date   INSULIN 25.8 (H) 07/06/2021   INSULIN 30.7 (H) 01/21/2021   Lab Results  Component Value Date   TSH 2.650 01/21/2021   Lab Results  Component Value Date   CHOL 148 01/21/2021   HDL 49 01/21/2021   LDLCALC 79 01/21/2021   TRIG 110 01/21/2021   Lab Results  Component Value Date   VD25OH 39.9 07/06/2021   VD25OH 26.4 (L) 01/21/2021   Lab Results  Component Value Date   WBC 4.8 01/21/2021   HGB 14.9 01/21/2021   HCT 44.5 01/21/2021   MCV 91 01/21/2021   PLT 239 01/21/2021   No results found for: IRON, TIBC, FERRITIN  Attestation Statements:   Reviewed by clinician on day of visit: allergies, medications, problem list, medical history, surgical history, family history, social history, and previous encounter notes.   I, Burt Knack, am acting as transcriptionist for Quillian Quince, MD.  I have reviewed the above documentation for accuracy and completeness, and I agree with the above. -  Quillian Quince, MD

## 2021-10-17 ENCOUNTER — Other Ambulatory Visit (INDEPENDENT_AMBULATORY_CARE_PROVIDER_SITE_OTHER): Payer: Self-pay | Admitting: Family Medicine

## 2021-10-17 DIAGNOSIS — R7303 Prediabetes: Secondary | ICD-10-CM

## 2021-10-19 DIAGNOSIS — H6123 Impacted cerumen, bilateral: Secondary | ICD-10-CM | POA: Diagnosis not present

## 2021-10-19 DIAGNOSIS — H6993 Unspecified Eustachian tube disorder, bilateral: Secondary | ICD-10-CM | POA: Diagnosis not present

## 2021-10-21 ENCOUNTER — Encounter: Payer: Self-pay | Admitting: Neurology

## 2021-10-22 ENCOUNTER — Ambulatory Visit (INDEPENDENT_AMBULATORY_CARE_PROVIDER_SITE_OTHER): Payer: BC Managed Care – PPO | Admitting: Family Medicine

## 2021-10-22 ENCOUNTER — Encounter: Payer: Self-pay | Admitting: *Deleted

## 2021-10-22 ENCOUNTER — Encounter (INDEPENDENT_AMBULATORY_CARE_PROVIDER_SITE_OTHER): Payer: Self-pay | Admitting: Family Medicine

## 2021-10-22 VITALS — BP 118/71 | HR 70 | Temp 97.9°F | Ht 74.0 in | Wt 324.0 lb

## 2021-10-22 DIAGNOSIS — Z6841 Body Mass Index (BMI) 40.0 and over, adult: Secondary | ICD-10-CM | POA: Diagnosis not present

## 2021-10-22 DIAGNOSIS — E559 Vitamin D deficiency, unspecified: Secondary | ICD-10-CM

## 2021-10-22 DIAGNOSIS — E669 Obesity, unspecified: Secondary | ICD-10-CM

## 2021-10-22 DIAGNOSIS — R7303 Prediabetes: Secondary | ICD-10-CM | POA: Diagnosis not present

## 2021-10-22 MED ORDER — VITAMIN D (ERGOCALCIFEROL) 1.25 MG (50000 UNIT) PO CAPS: 50000.0000 [IU] | ORAL_CAPSULE | ORAL | 0 refills | Status: DC

## 2021-10-22 MED ORDER — METFORMIN HCL 500 MG PO TABS: 500.0000 mg | ORAL_TABLET | Freq: Every day | ORAL | 0 refills | Status: DC

## 2021-10-26 NOTE — Progress Notes (Signed)
Chief Complaint:   OBESITY Kevin [name] is here to discuss his progress with his obesity treatment plan along with follow-up of his obesity related diagnoses. Kevin [name] is on following a lower carbohydrate, vegetable and lean protein rich diet plan and states he is following his eating plan approximately 80% of the time. Kevin [name] states he is walking 60 minutes 4 times per week.  Today's visit was #: 12 Starting weight: 349 lbs Starting date: 01/21/2021 Today's weight: 324 lbs Today's date: 10/22/2021 Total lbs lost to date: 25 lbs Total lbs lost since last in-office visit: 9  Interim History: Kevin [name] tried low carb over last few weeks. He is working on getting acclimated to CPAP machine-now getting 6- hours per night. Going to [place] tomorrow until July 4. Was able to stick close to plan even with Father's Day and graduation party. 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  Subjective:   1. Pre-diabetes Shandon's last A1c was 6.1. He is on Metformin now. Denies GI side effects.  2. Vitamin D deficiency Bharat's last Vit D level of 39.9. Denies any nausea, vomiting or muscle weakness. He notes fatigue.  Assessment/Plan:   1. Pre-diabetes We will refill Metformin 500 mg by mouth daily for 1 month with 0 refills. Will obtain labs with PCP.  -Refill metFORMIN (GLUCOPHAGE) 500 MG tablet; Take 1 tablet (500 mg total) by mouth daily with breakfast.  Dispense: 30 tablet; Refill: 0  2. Vitamin D deficiency We will refill Vit D 50,000 IU once a week for 1 month with 0 refills. Will obtain labs with PCP.  -Refill Vitamin D, Ergocalciferol, (DRISDOL) 1.25 MG (50000 UNIT) CAPS capsule; Take 1 capsule (50,000 Units total) by mouth every 7 (seven) days.  Dispense: 4 capsule; Refill: 0  3. Obesity, Current BMI 41.7 Kevin [name] is currently in the action stage of change. As such, his goal is to continue with weight loss efforts. He has agreed to the Category 4 Plan and following a lower  carbohydrate, vegetable and lean protein rich diet plan.   Exercise goals:  Kevin [name] will continue current weigjted walks and hikes.  Behavioral modification strategies: increasing lean protein intake, meal planning and cooking strategies, keeping healthy foods in the home, and travel eating strategies.  Kevin [name] has agreed to follow-up with our clinic in 4 weeks. He was informed of the importance of frequent follow-up visits to maximize his success with intensive lifestyle modifications for his multiple health conditions.   Objective:   Blood pressure 118/71, pulse 70, temperature 97.9 F (36.6 C), height 6\' 2"  (1.88 m), weight (!) 324 lb (147 kg), SpO2 97 %. Body mass index is 41.6 kg/m.  General: Cooperative, alert, well developed, in no acute distress. HEENT: Conjunctivae and lids unremarkable. Cardiovascular: Regular rhythm.  Lungs: Normal work of breathing. Neurologic: No focal deficits.   Lab Results  Component Value Date   CREATININE 1.01 07/06/2021   BUN 11 07/06/2021   NA 139 07/06/2021   K 3.8 07/06/2021   CL 100 07/06/2021   CO2 24 07/06/2021   Lab Results  Component Value Date   ALT 32 07/06/2021   AST 23 07/06/2021   ALKPHOS 97 07/06/2021   BILITOT 0.8 07/06/2021   Lab Results  Component Value Date   HGBA1C 6.1 (H) 07/06/2021   HGBA1C 6.2 (H) 01/21/2021   Lab Results  Component Value Date   INSULIN 25.8 (H) 07/06/2021   INSULIN 30.7 (H) 01/21/2021   Lab Results  Component Value Date  TSH 2.650 01/21/2021   Lab Results  Component Value Date   CHOL 148 01/21/2021   HDL 49 01/21/2021   LDLCALC 79 01/21/2021   TRIG 110 01/21/2021   Lab Results  Component Value Date   VD25OH 39.9 07/06/2021   VD25OH 26.4 (L) 01/21/2021   Lab Results  Component Value Date   WBC 4.8 01/21/2021   HGB 14.9 01/21/2021   HCT 44.5 01/21/2021   MCV 91 01/21/2021   PLT 239 01/21/2021   No results found for: "IRON", "TIBC", "FERRITIN"   Attestation Statements:    Reviewed by clinician on day of visit: allergies, medications, problem list, medical history, surgical history, family history, social history, and previous encounter notes.  I, Fortino Sic, RMA am acting as transcriptionist for Reuben Likes, MD. I have reviewed the above documentation for accuracy and completeness, and I agree with the above. - Reuben Likes, MD

## 2021-10-28 DIAGNOSIS — G4733 Obstructive sleep apnea (adult) (pediatric): Secondary | ICD-10-CM | POA: Diagnosis not present

## 2021-10-28 NOTE — Telephone Encounter (Signed)
Note emailed to pt

## 2021-11-04 DIAGNOSIS — I10 Essential (primary) hypertension: Secondary | ICD-10-CM | POA: Diagnosis not present

## 2021-11-04 DIAGNOSIS — Z125 Encounter for screening for malignant neoplasm of prostate: Secondary | ICD-10-CM | POA: Diagnosis not present

## 2021-11-04 DIAGNOSIS — Z0000 Encounter for general adult medical examination without abnormal findings: Secondary | ICD-10-CM | POA: Diagnosis not present

## 2021-11-04 DIAGNOSIS — E7800 Pure hypercholesterolemia, unspecified: Secondary | ICD-10-CM | POA: Diagnosis not present

## 2021-11-04 DIAGNOSIS — R7309 Other abnormal glucose: Secondary | ICD-10-CM | POA: Diagnosis not present

## 2021-11-09 ENCOUNTER — Ambulatory Visit: Payer: BC Managed Care – PPO | Admitting: Neurology

## 2021-11-09 ENCOUNTER — Encounter: Payer: Self-pay | Admitting: Neurology

## 2021-11-09 VITALS — BP 127/71 | HR 81 | Ht 74.0 in | Wt 331.2 lb

## 2021-11-09 DIAGNOSIS — G4733 Obstructive sleep apnea (adult) (pediatric): Secondary | ICD-10-CM | POA: Diagnosis not present

## 2021-11-09 DIAGNOSIS — R634 Abnormal weight loss: Secondary | ICD-10-CM

## 2021-11-09 DIAGNOSIS — Z9989 Dependence on other enabling machines and devices: Secondary | ICD-10-CM | POA: Diagnosis not present

## 2021-11-09 NOTE — Patient Instructions (Addendum)
It was nice to see you again today.  You are fully compliant with your CPAP.  As discussed, we will reduce your current pressure from 16 cm to 14 cm, this may improve your mouth dryness and the leak of the air from the mask as well.  As you continue to lose weight, we may be able to adjust your pressure over time.    Please continue using your CPAP regularly. While your insurance requires that you use CPAP at least 4 hours each night on 70% of the nights, I recommend, that you not skip any nights and use it throughout the night if you can. Getting used to CPAP and staying with the treatment long term does take time and patience and discipline. Untreated obstructive sleep apnea when it is moderate to severe can have an adverse impact on cardiovascular health and raise her risk for heart disease, arrhythmias, hypertension, congestive heart failure, stroke and diabetes. Untreated obstructive sleep apnea causes sleep disruption, nonrestorative sleep, and sleep deprivation. This can have an impact on your day to day functioning and cause daytime sleepiness and impairment of cognitive function, memory loss, mood disturbance, and problems focussing. Using CPAP regularly can improve these symptoms. Keep up the good work! We will see you back in 6 months for sleep apnea check up, you can see one of our nurse practitioners.  We can offer you a MyChart video visit if you like.

## 2021-11-09 NOTE — Progress Notes (Signed)
Changed in airview pressure to 14cm.  Fax confirmation received [facility].  [phone].

## 2021-11-09 NOTE — Progress Notes (Signed)
Subjective:    Patient ID: Kevin [name] is a 51 y.o. male.  HPI    Interim history:    Mr. Pugh is a 51 year old right-handed gentleman with an underlying medical history of hypertension, hyperlipidemia, fatty liver, shortness of breath, and severe obesity with a BMI of over 40, who Who presents for follow-up consultation of his obstructive sleep apnea after interim testing and starting CPAP therapy.  The patient is unaccompanied today.  I first met him at the request of Dr. Jearld Shines on 07/01/2021, at which time he reported snoring and excessive daytime somnolence as well as witnessed apneas.  He was advised to proceed with a sleep study.  He had a laboratory attended sleep study on 08/04/2021 and qualified for an emergency split-night study due to severe sleep apnea.  Baseline sleep efficiency was 66%, sleep latency 70 minutes, REM latency 65 minutes.  Overall AHI was 53.8/h, average oxygen saturation was 94%, nadir was 66% during supine REM sleep.  He was placed on CPAP therapy via Evora fullface mask from [employer], size large centimeters, titrated to a final pressure of 16 cm, at which time his AHI was 15.8/h, O2 nadir 85% with supine non-REM sleep achieved.  Based on his test results I prescribed home BiPAP therapy empirically but the insurance did not cover BiPAP therapy.  I prescribed home CPAP therapy at 16 centimeters.  He has a ResMed AirSense 10 AutoSet machine, set up date was 08/28/2021.  Today, 11/09/2021: I reviewed his CPAP compliance data from 10/06/2021 through 11/04/2021, which is a total of 30 days, during which time he used his machine 29 days with percent use days greater than 4 hours at 97%, indicating excellent compliance with an average usage of 7 hours and 30 minutes, residual AHI at goal at 1.9/h, leak on the higher side with the 95th percentile at 28.5 L/min on a pressure of 16 cm with EPR of 3.  He reports doing fairly well with his CPAP, did have initial adjustment difficulty  especially with the higher pressure, he had dry mouth and sore throat fairly consistently in the first week or so.  He tolerates the full facemask but is a side sleeper and sometimes notices the mask dislodging.  He just returned from a trip to [place] with the [employer] and took his machine with him.  He is very motivated to continue with treatment, snoring is much better per wife's feedback and nocturia and middle of the night awakenings have improved also. Of note, he has been working on weight loss and has lost a good 10 pounds since our first visit. Has had some weight fluctuations, have gone down to 324 pounds for his lowest in the recent past.  The patient's allergies, current medications, family history, past medical history, past social history, past surgical history and problem list were reviewed and updated as appropriate.   Previously:   07/01/21: (He) reports snoring and excessive daytime somnolence, as well as witnessed apneas per wife's report.  He reports that his brother was recently diagnosed with sleep apnea and has a CPAP machine.  I reviewed your office note from 01/21/2021.  His Epworth sleepiness score is 6 out of 24, fatigue severity score is 19 out of 63.  He does admit to feeling sleepy when he is sedentary.  He works from [employer] for [employer], 4 days in the office and 1 day from home.  He lives with his wife and 2 year old son.  He is working on  weight loss and has lost about 20 pounds since starting his weight loss program.  He has nocturia about once per average night, denies recurrent morning headaches.  He has a bedtime between 10 and midnight and rise time generally between 630 and 7 AM.  He drinks caffeine in the form of coffee, about 3 to 4 cups before lunchtime.  He drinks alcohol very occasionally, he is a non-smoker.  They have 1 dog in the household and the dog sleeps in the bedroom with them.  He does not watch TV in his bedroom.  His Past Medical History Is  Significant For: Past Medical History:  Diagnosis Date   Fatty liver    Hyperlipidemia    Hypertension     His Past Surgical History Is Significant For: Past Surgical History:  Procedure Laterality Date   COLONOSCOPY     ROTATOR CUFF REPAIR Left 2017    His Family History Is Significant For: Family History  Problem Relation Age of Onset   Stroke Father    Hyperlipidemia Father    Hypertension Father    Heart disease Father    Obesity Father    Sleep apnea Brother     His Social History Is Significant For: Social History   Socioeconomic History   Marital status: Married    Spouse name: Not on file   Number of children: Not on file   Years of education: Not on file   Highest education level: Not on file  Occupational History   Occupation: Sr Buyer, retail: [employer]  Tobacco Use   Smoking status: Never   Smokeless tobacco: Never  Vaping Use   Vaping Use: Never used  Substance and Sexual Activity   Alcohol use: Yes    Comment: occasional only   Drug use: Never   Sexual activity: Not on file  Other Topics Concern   Not on file  Social History Narrative   Lives at home with wife & son   Right handed   Caffeine: 3-4 cups/day   Social Determinants of Health   Financial Resource Strain: Not on file  Food Insecurity: Not on file  Transportation Needs: Not on file  Physical Activity: Not on file  Stress: Not on file  Social Connections: Not on file    His Allergies Are:  No Known Allergies:   His Current Medications Are:  Outpatient Encounter Medications as of 11/09/2021  Medication Sig   atorvastatin (LIPITOR) 20 MG tablet Take 20 mg by mouth daily.   fluticasone (FLONASE) 50 MCG/ACT nasal spray Place into both nostrils daily.   levocetirizine (XYZAL) 5 MG tablet Take 5 mg by mouth every evening.   lisinopril-hydrochlorothiazide (ZESTORETIC) 10-12.5 MG tablet Take 1 tablet by mouth daily.   metFORMIN (GLUCOPHAGE) 500 MG tablet  Take 1 tablet (500 mg total) by mouth daily with breakfast.   Vitamin D, Ergocalciferol, (DRISDOL) 1.25 MG (50000 UNIT) CAPS capsule Take 1 capsule (50,000 Units total) by mouth every 7 (seven) days.   No facility-administered encounter medications on file as of 11/09/2021.  :  Review of Systems:  Out of a complete 14 point review of systems, all are reviewed and negative with the exception of these symptoms as listed below:   Review of Systems  Neurological:        Iintial cpap follow up.  Set up 08-28-2021. Was having, dry throat, is side sleeper/ mask comes off sometimes.      Objective:  Neurological Exam  Physical Exam Physical Examination:   Vitals:   11/09/21 0738  BP: 127/71  Pulse: 81    General Examination: The patient is a very pleasant 51 y.o. male in no acute distress. He appears well-developed and well-nourished and well groomed.   HEENT: Normocephalic, atraumatic, pupils are equal, round and reactive to light, extraocular tracking is well-preserved, speech is clear without dysarthria, hypophonia or voice tremor, hearing grossly intact.  Neck with full range of motion, no carotid bruits.  Airway examination reveals no significant mouth dryness, moderate airway crowding, stable neck circumference of 19-7/8.  Tongue protrudes centrally and palate elevates symmetrically.     Chest: Clear to auscultation without wheezing, rhonchi or crackles noted.   Heart: S1+S2+0, regular and normal without murmurs, rubs or gallops noted.    Abdomen: Soft, non-tender and non-distended.   Extremities: There is no pitting edema in the distal lower extremities bilaterally.    Skin: Warm and dry without trophic changes noted.    Musculoskeletal: exam reveals no obvious joint deformities.    Neurologically:  Mental status: The patient is awake, alert and oriented in all 4 spheres. His immediate and remote memory, attention, language skills and fund of knowledge are appropriate. There  is no evidence of aphasia, agnosia, apraxia or anomia. Speech is clear with normal prosody and enunciation. Thought process is linear. Mood is normal and affect is normal.  Cranial nerves II - XII are as described above under HEENT exam.  Motor exam: Normal bulk, strength and tone is noted. There is no obvious tremor. Fine motor skills and coordination: grossly intact.  Cerebellar testing: No dysmetria or intention tremor. There is no truncal or gait ataxia.  Sensory exam: intact to light touch in the upper and lower extremities.  Gait, station and balance: He stands easily. No veering to one side is noted. No leaning to one side is noted. Posture is age-appropriate and stance is narrow based. Gait shows normal stride length and normal pace. No problems turning are noted.   Assessment and Plan:    In summary, GARY GABRIELSEN is a very pleasant 51 year old male with an underlying medical history of hypertension, hyperlipidemia, fatty liver, shortness of breath, and severe obesity with a BMI of over 40, who presents for follow-up consultation of his obstructive sleep apnea, which was deemed in the severe range by split-night sleep testing on 08/04/2021.  He has been on CPAP of 16 cm since late April 2023 and is compliant with treatment.  He has benefited from treatment, he has been able to lose weight and is motivated to continue with CPAP therapy and continue with his weight loss journey.  He is advised to follow-up in 6 months to see one of our nurse practitioners, we can offer a video visit at the time for his convenience.  We talked about his sleep study results in detail and reviewed his compliance data.  I would like to see if we can get away with a slightly lower pressure, he has had some mouth dryness and sore throat especially in the beginning of treatment and the air leak from the mask is generally speaking higher.  To that end, I recommend we reduce his current pressure from 16 cm to 14 cm.  We can  adjust the pressure going forward depending on his tolerance level, apnea control and weight.  We can review his compliance data before his next appointment if he would like, he is advised to get in touch with [place] via  MyChart if need be.  He is commended for his treatment adherence and encouraged to continue with full compliance with his CPAP therapy.  I answered all his questions today and he was in agreement with our plan.  I spent 30 minutes in total face-to-face time and in reviewing records during pre-charting, more than 50% of which was spent in counseling and coordination of care, reviewing test results, reviewing medications and treatment regimen and/or in discussing or reviewing the diagnosis of OSA, the prognosis and treatment options. Pertinent laboratory and imaging test results that were available during this visit with the patient were reviewed by me and considered in my medical decision making (see chart for details).

## 2021-11-13 ENCOUNTER — Other Ambulatory Visit (INDEPENDENT_AMBULATORY_CARE_PROVIDER_SITE_OTHER): Payer: Self-pay | Admitting: Family Medicine

## 2021-11-13 DIAGNOSIS — R7303 Prediabetes: Secondary | ICD-10-CM

## 2021-11-25 ENCOUNTER — Ambulatory Visit: Payer: BC Managed Care – PPO | Admitting: Neurology

## 2021-11-27 DIAGNOSIS — G4733 Obstructive sleep apnea (adult) (pediatric): Secondary | ICD-10-CM | POA: Diagnosis not present

## 2021-12-02 ENCOUNTER — Ambulatory Visit (INDEPENDENT_AMBULATORY_CARE_PROVIDER_SITE_OTHER): Payer: BC Managed Care – PPO | Admitting: Family Medicine

## 2021-12-09 ENCOUNTER — Encounter (INDEPENDENT_AMBULATORY_CARE_PROVIDER_SITE_OTHER): Payer: Self-pay

## 2021-12-11 DIAGNOSIS — G4733 Obstructive sleep apnea (adult) (pediatric): Secondary | ICD-10-CM | POA: Diagnosis not present

## 2021-12-17 ENCOUNTER — Ambulatory Visit (INDEPENDENT_AMBULATORY_CARE_PROVIDER_SITE_OTHER): Payer: BC Managed Care – PPO | Admitting: Family Medicine

## 2021-12-17 DIAGNOSIS — L718 Other rosacea: Secondary | ICD-10-CM | POA: Diagnosis not present

## 2021-12-22 ENCOUNTER — Other Ambulatory Visit (INDEPENDENT_AMBULATORY_CARE_PROVIDER_SITE_OTHER): Payer: Self-pay | Admitting: Family Medicine

## 2021-12-22 ENCOUNTER — Encounter (INDEPENDENT_AMBULATORY_CARE_PROVIDER_SITE_OTHER): Payer: Self-pay | Admitting: Family Medicine

## 2021-12-22 ENCOUNTER — Ambulatory Visit (INDEPENDENT_AMBULATORY_CARE_PROVIDER_SITE_OTHER): Payer: BC Managed Care – PPO | Admitting: Family Medicine

## 2021-12-22 VITALS — BP 130/81 | HR 83 | Temp 98.5°F | Ht 74.0 in | Wt 328.0 lb

## 2021-12-22 DIAGNOSIS — R7303 Prediabetes: Secondary | ICD-10-CM

## 2021-12-22 DIAGNOSIS — E669 Obesity, unspecified: Secondary | ICD-10-CM

## 2021-12-22 DIAGNOSIS — Z6841 Body Mass Index (BMI) 40.0 and over, adult: Secondary | ICD-10-CM

## 2021-12-22 DIAGNOSIS — E559 Vitamin D deficiency, unspecified: Secondary | ICD-10-CM | POA: Diagnosis not present

## 2021-12-22 MED ORDER — METFORMIN HCL 500 MG PO TABS: 500.0000 mg | ORAL_TABLET | Freq: Every day | ORAL | 0 refills | Status: DC

## 2021-12-22 MED ORDER — VITAMIN D (ERGOCALCIFEROL) 1.25 MG (50000 UNIT) PO CAPS: 50000.0000 [IU] | ORAL_CAPSULE | ORAL | 0 refills | Status: DC

## 2021-12-28 DIAGNOSIS — G4733 Obstructive sleep apnea (adult) (pediatric): Secondary | ICD-10-CM | POA: Diagnosis not present

## 2022-01-05 NOTE — Progress Notes (Signed)
Chief Complaint:   OBESITY Kevin [name] is here to discuss his progress with his obesity treatment plan along with follow-up of his obesity related diagnoses. Kevin [name] is on the Category 4 Plan and following a lower carbohydrate, vegetable and lean protein rich diet plan and states he is following his eating plan approximately 50% of the time. Kevin [name] states he is hiking 10 miles/6,000 steps  9 days/3 times per week.  Today's visit was #: 13 Starting weight: 349 lbs Starting date: 01/21/2021 Today's weight: 328 lbs Today's date: 12/22/2021 Total lbs lost to date: 21 lbs Total lbs lost since last in-office visit: 0  Interim History: Since Kevin [name]'s last appointment he went to [place] and hiked 10 miles a day for 9 days. Didn't have much control over food he took in while away. He got back July 4th. Colonoscopy was rescheduled. He has been out of town frequently on the weekends. When home during the week he is trying to stick to Cat 4.  Subjective:   1. Vitamin D deficiency Kevin [name] is currently taking prescription Vit D 50,000 IU once a week. Denies any nausea, vomiting or muscle weakness.He notes fatigue. Last Vit D level of 26.4.  2. Pre-diabetes Kevin [name] A1c on 11/04/21 of 5.7 (improved from 6.1). He is on Metformin. Denies GI side effects.  Assessment/Plan:   1. Vitamin D deficiency We will refill Vit D 50K IU weekly for 1 month with 0 refills.  -Refill Vitamin D, Ergocalciferol, (DRISDOL) 1.25 MG (50000 UNIT) CAPS capsule; Take 1 capsule (50,000 Units total) by mouth every 7 (seven) days.  Dispense: 4 capsule; Refill: 0  2. Pre-diabetes We will refill Metformin 500 mg by mouth daily for 1 month with 0 refills.  3. Obesity, Current BMI 42.2 Kevin [name] is currently in the action stage of change. As such, his goal is to continue with weight loss efforts. He has agreed to the Category 4 Plan.   Exercise goals: All adults should avoid inactivity. Some physical activity is better  than none, and adults who participate in any amount of physical activity gain some health benefits.  Behavioral modification strategies: increasing lean protein intake, meal planning and cooking strategies, and keeping healthy foods in the home.  Kevin [name] has agreed to follow-up with our clinic in 4 weeks. He was informed of the importance of frequent follow-up visits to maximize his success with intensive lifestyle modifications for his multiple health conditions.   Objective:   Blood pressure 130/81, pulse 83, temperature 98.5 F (36.9 C), height 6\' 2"  (1.88 m), weight (!) 328 lb (148.8 kg), SpO2 96 %. Body mass index is 42.11 kg/m.  General: Cooperative, alert, well developed, in no acute distress. HEENT: Conjunctivae and lids unremarkable. Cardiovascular: Regular rhythm.  Lungs: Normal work of breathing. Neurologic: No focal deficits.   Lab Results  Component Value Date   CREATININE 1.01 07/06/2021   BUN 11 07/06/2021   NA 139 07/06/2021   K 3.8 07/06/2021   CL 100 07/06/2021   CO2 24 07/06/2021   Lab Results  Component Value Date   ALT 32 07/06/2021   AST 23 07/06/2021   ALKPHOS 97 07/06/2021   BILITOT 0.8 07/06/2021   Lab Results  Component Value Date   HGBA1C 6.1 (H) 07/06/2021   HGBA1C 6.2 (H) 01/21/2021   Lab Results  Component Value Date   INSULIN 25.8 (H) 07/06/2021   INSULIN 30.7 (H) 01/21/2021   Lab Results  Component Value Date   TSH 2.650 01/21/2021  Lab Results  Component Value Date   CHOL 148 01/21/2021   HDL 49 01/21/2021   LDLCALC 79 01/21/2021   TRIG 110 01/21/2021   Lab Results  Component Value Date   VD25OH 39.9 07/06/2021   VD25OH 26.4 (L) 01/21/2021   Lab Results  Component Value Date   WBC 4.8 01/21/2021   HGB 14.9 01/21/2021   HCT 44.5 01/21/2021   MCV 91 01/21/2021   PLT 239 01/21/2021   No results found for: "IRON", "TIBC", "FERRITIN"  Attestation Statements:   Reviewed by clinician on day of visit: allergies,  medications, problem list, medical history, surgical history, family history, social history, and previous encounter notes.  I, Fortino Sic, RMA am acting as transcriptionist for Reuben Likes, MD.  I have reviewed the above documentation for accuracy and completeness, and I agree with the above. - Reuben Likes, MD

## 2022-01-20 DIAGNOSIS — D123 Benign neoplasm of transverse colon: Secondary | ICD-10-CM | POA: Diagnosis not present

## 2022-01-20 DIAGNOSIS — Z86010 Personal history of colonic polyps: Secondary | ICD-10-CM | POA: Diagnosis not present

## 2022-01-20 DIAGNOSIS — D124 Benign neoplasm of descending colon: Secondary | ICD-10-CM | POA: Diagnosis not present

## 2022-01-21 ENCOUNTER — Encounter (INDEPENDENT_AMBULATORY_CARE_PROVIDER_SITE_OTHER): Payer: Self-pay | Admitting: Family Medicine

## 2022-01-21 ENCOUNTER — Ambulatory Visit (INDEPENDENT_AMBULATORY_CARE_PROVIDER_SITE_OTHER): Payer: BC Managed Care – PPO | Admitting: Family Medicine

## 2022-01-21 VITALS — BP 135/71 | HR 75 | Temp 98.5°F | Ht 74.0 in | Wt 321.0 lb

## 2022-01-21 DIAGNOSIS — G4733 Obstructive sleep apnea (adult) (pediatric): Secondary | ICD-10-CM | POA: Diagnosis not present

## 2022-01-21 DIAGNOSIS — Z6841 Body Mass Index (BMI) 40.0 and over, adult: Secondary | ICD-10-CM

## 2022-01-21 DIAGNOSIS — E559 Vitamin D deficiency, unspecified: Secondary | ICD-10-CM | POA: Diagnosis not present

## 2022-01-21 DIAGNOSIS — E669 Obesity, unspecified: Secondary | ICD-10-CM | POA: Diagnosis not present

## 2022-01-21 MED ORDER — VITAMIN D (ERGOCALCIFEROL) 1.25 MG (50000 UNIT) PO CAPS: 50000.0000 [IU] | ORAL_CAPSULE | ORAL | 0 refills | Status: DC

## 2022-01-26 NOTE — Progress Notes (Signed)
Chief Complaint:   OBESITY Kevin [name] is here to discuss his progress with his obesity treatment plan along with follow-up of his obesity related diagnoses. Kevin [name] is on the Category 4 Plan and states he is following his eating plan approximately 70% of the time. Kevin [name] states he is walking and moving 6,000 steps 1-3 times per week.  Today's visit was #: 14 Starting weight: 349 lbs Starting date: 01/21/2021 Today's weight: 321 lbs Today's date: 01/21/2022 Total lbs lost to date: 28 lbs Total lbs lost since last in-office visit: 7  Interim History: Kevin [name] has been moving his son down to [place]. Still on plan for a majority of time even with trips to [place] without upcoming trips for the next few weeks. Wife got memberships to [employer] so planning or starting that. Thinks Cat 4 will be easiest to stick with.  Subjective:   1. Vitamin D deficiency Kevin [name]'s Vit D level of 39.9. Denies any nausea, vomiting or muscle weakness. He notes fatigue.  2. OSA (obstructive sleep apnea) Kevin [name] is wearing a CPAP nightly ( achieving 90% nightly). Bringing CPAP during travel.  Assessment/Plan:   1. Vitamin D deficiency We will refill Vit D 50,000 IU once a week for 1 month with 0 refills.  -Refill Vitamin D, Ergocalciferol, (DRISDOL) 1.25 MG (50000 UNIT) CAPS capsule; Take 1 capsule (50,000 Units total) by mouth every 7 (seven) days.  Dispense: 4 capsule; Refill: 0  2. OSA (obstructive sleep apnea) Follow up with compliance at next appointment.  3. Obesity, Current BMI 41.3 Kevin [name] is currently in the action stage of change. As such, his goal is to continue with weight loss efforts. He has agreed to the Category 4 Plan.   Exercise goals: All adults should avoid inactivity. Some physical activity is better than none, and adults who participate in any amount of physical activity gain some health benefits.  Behavioral modification strategies: increasing lean protein intake,  meal planning and cooking strategies, and keeping healthy foods in the home.  Kevin [name] has agreed to follow-up with our clinic in 4 weeks. He was informed of the importance of frequent follow-up visits to maximize his success with intensive lifestyle modifications for his multiple health conditions.   Objective:   Blood pressure 135/71, pulse 75, temperature 98.5 F (36.9 C), height 6\' 2"  (1.88 m), weight (!) 321 lb (145.6 kg), SpO2 95 %. Body mass index is 41.21 kg/m.  General: Cooperative, alert, well developed, in no acute distress. HEENT: Conjunctivae and lids unremarkable. Cardiovascular: Regular rhythm.  Lungs: Normal work of breathing. Neurologic: No focal deficits.   Lab Results  Component Value Date   CREATININE 1.01 07/06/2021   BUN 11 07/06/2021   NA 139 07/06/2021   K 3.8 07/06/2021   CL 100 07/06/2021   CO2 24 07/06/2021   Lab Results  Component Value Date   ALT 32 07/06/2021   AST 23 07/06/2021   ALKPHOS 97 07/06/2021   BILITOT 0.8 07/06/2021   Lab Results  Component Value Date   HGBA1C 6.1 (H) 07/06/2021   HGBA1C 6.2 (H) 01/21/2021   Lab Results  Component Value Date   INSULIN 25.8 (H) 07/06/2021   INSULIN 30.7 (H) 01/21/2021   Lab Results  Component Value Date   TSH 2.650 01/21/2021   Lab Results  Component Value Date   CHOL 148 01/21/2021   HDL 49 01/21/2021   LDLCALC 79 01/21/2021   TRIG 110 01/21/2021   Lab Results  Component Value Date  VD25OH 39.9 07/06/2021   VD25OH 26.4 (L) 01/21/2021   Lab Results  Component Value Date   WBC 4.8 01/21/2021   HGB 14.9 01/21/2021   HCT 44.5 01/21/2021   MCV 91 01/21/2021   PLT 239 01/21/2021   No results found for: "IRON", "TIBC", "FERRITIN"  Attestation Statements:   Reviewed by clinician on day of visit: allergies, medications, problem list, medical history, surgical history, family history, social history, and previous encounter notes.  I, Fortino Sic, RMA am acting as  transcriptionist for Reuben Likes, MD.  I have reviewed the above documentation for accuracy and completeness, and I agree with the above. - Reuben Likes, MD

## 2022-01-28 DIAGNOSIS — G4733 Obstructive sleep apnea (adult) (pediatric): Secondary | ICD-10-CM | POA: Diagnosis not present

## 2022-02-27 DIAGNOSIS — G4733 Obstructive sleep apnea (adult) (pediatric): Secondary | ICD-10-CM | POA: Diagnosis not present

## 2022-03-02 ENCOUNTER — Encounter (INDEPENDENT_AMBULATORY_CARE_PROVIDER_SITE_OTHER): Payer: Self-pay | Admitting: Family Medicine

## 2022-03-02 ENCOUNTER — Ambulatory Visit (INDEPENDENT_AMBULATORY_CARE_PROVIDER_SITE_OTHER): Payer: BC Managed Care – PPO | Admitting: Family Medicine

## 2022-03-02 VITALS — BP 120/69 | HR 66 | Temp 97.9°F | Ht 74.0 in | Wt 325.0 lb

## 2022-03-02 DIAGNOSIS — E669 Obesity, unspecified: Secondary | ICD-10-CM | POA: Diagnosis not present

## 2022-03-02 DIAGNOSIS — E559 Vitamin D deficiency, unspecified: Secondary | ICD-10-CM | POA: Diagnosis not present

## 2022-03-02 DIAGNOSIS — R7303 Prediabetes: Secondary | ICD-10-CM

## 2022-03-02 DIAGNOSIS — F3289 Other specified depressive episodes: Secondary | ICD-10-CM

## 2022-03-02 DIAGNOSIS — Z6841 Body Mass Index (BMI) 40.0 and over, adult: Secondary | ICD-10-CM

## 2022-03-02 DIAGNOSIS — F32A Depression, unspecified: Secondary | ICD-10-CM | POA: Insufficient documentation

## 2022-03-02 MED ORDER — VITAMIN D (ERGOCALCIFEROL) 1.25 MG (50000 UNIT) PO CAPS: 50000.0000 [IU] | ORAL_CAPSULE | ORAL | 0 refills | Status: DC

## 2022-03-02 MED ORDER — METFORMIN HCL 500 MG PO TABS
ORAL_TABLET | ORAL | 0 refills | Status: DC
Start: 2022-03-02 — End: 2022-05-27

## 2022-03-02 MED ORDER — BUPROPION HCL ER (SR) 150 MG PO TB12: 150.0000 mg | ORAL_TABLET | Freq: Every day | ORAL | 0 refills | Status: DC

## 2022-03-09 NOTE — Progress Notes (Unsigned)
Chief Complaint:   OBESITY Kevin [name] is here to discuss his progress with his obesity treatment plan along with follow-up of his obesity related diagnoses. Kevin [name] is on {MWMwtlossportion/plan2:23431} and states he is following his eating plan approximately ***% of the time. Adyan states he is *** *** minutes *** times per week.  Today's visit was #: *** Starting weight: *** Starting date: *** Today's weight: *** Today's date: 03/02/2022 Total lbs lost to date: *** Total lbs lost since last in-office visit: ***  Interim History: ***  Subjective:   1. Pre-diabetes ***  2. Vitamin D deficiency ***  3. Other depression, emotional eating behavior ***  Assessment/Plan:   1. Pre-diabetes *** - metFORMIN (GLUCOPHAGE) 500 MG tablet; TAKE 1 TABLET BY MOUTH EVERY DAY WITH BREAKFAST  Dispense: 90 tablet; Refill: 0  2. Vitamin D deficiency *** - Vitamin D, Ergocalciferol, (DRISDOL) 1.25 MG (50000 UNIT) CAPS capsule; Take 1 capsule (50,000 Units total) by mouth every 7 (seven) days.  Dispense: 4 capsule; Refill: 0  3. Other depression, emotional eating behavior *** - buPROPion (WELLBUTRIN SR) 150 MG 12 hr tablet; Take 1 tablet (150 mg total) by mouth daily.  Dispense: 30 tablet; Refill: 0  4. Obesity, Current BMI 41.7 Kevin [name] is currently in the action stage of change. As such, his goal is to continue with weight loss efforts. He has agreed to the Category 4 Plan.   Exercise goals: All adults should avoid inactivity. Some physical activity is better than none, and adults who participate in any amount of physical activity gain some health benefits.  Behavioral modification strategies: increasing lean protein intake, decreasing simple carbohydrates, meal planning and cooking strategies, emotional eating strategies, and avoiding temptations.  Kevin [name] has agreed to follow-up with our clinic in 4 weeks. He was informed of the importance of frequent follow-up visits to maximize  his success with intensive lifestyle modifications for his multiple health conditions.   Objective:   Blood pressure 120/69, pulse 66, temperature 97.9 F (36.6 C), height 6\' 2"  (1.88 m), weight (!) 325 lb (147.4 kg), SpO2 96 %. Body mass index is 41.73 kg/m.  General: Cooperative, alert, well developed, in no acute distress. HEENT: Conjunctivae and lids unremarkable. Cardiovascular: Regular rhythm.  Lungs: Normal work of breathing. Neurologic: No focal deficits.   Lab Results  Component Value Date   CREATININE 1.01 07/06/2021   BUN 11 07/06/2021   NA 139 07/06/2021   K 3.8 07/06/2021   CL 100 07/06/2021   CO2 24 07/06/2021   Lab Results  Component Value Date   ALT 32 07/06/2021   AST 23 07/06/2021   ALKPHOS 97 07/06/2021   BILITOT 0.8 07/06/2021   Lab Results  Component Value Date   HGBA1C 6.1 (H) 07/06/2021   HGBA1C 6.2 (H) 01/21/2021   Lab Results  Component Value Date   INSULIN 25.8 (H) 07/06/2021   INSULIN 30.7 (H) 01/21/2021   Lab Results  Component Value Date   TSH 2.650 01/21/2021   Lab Results  Component Value Date   CHOL 148 01/21/2021   HDL 49 01/21/2021   LDLCALC 79 01/21/2021   TRIG 110 01/21/2021   Lab Results  Component Value Date   VD25OH 39.9 07/06/2021   VD25OH 26.4 (L) 01/21/2021   Lab Results  Component Value Date   WBC 4.8 01/21/2021   HGB 14.9 01/21/2021   HCT 44.5 01/21/2021   MCV 91 01/21/2021   PLT 239 01/21/2021   No results found for: "IRON", "TIBC", "  FERRITIN"  Attestation Statements:   Reviewed by clinician on day of visit: allergies, medications, problem list, medical history, surgical history, family history, social history, and previous encounter notes.   I, Trixie Dredge, am acting as transcriptionist for Dennard Nip, MD.  I have reviewed the above documentation for accuracy and completeness, and I agree with the above. -  ***

## 2022-03-30 ENCOUNTER — Other Ambulatory Visit (INDEPENDENT_AMBULATORY_CARE_PROVIDER_SITE_OTHER): Payer: Self-pay | Admitting: Physician Assistant

## 2022-03-30 DIAGNOSIS — F3289 Other specified depressive episodes: Secondary | ICD-10-CM

## 2022-03-30 DIAGNOSIS — G4733 Obstructive sleep apnea (adult) (pediatric): Secondary | ICD-10-CM | POA: Diagnosis not present

## 2022-04-01 ENCOUNTER — Encounter (INDEPENDENT_AMBULATORY_CARE_PROVIDER_SITE_OTHER): Payer: Self-pay | Admitting: Physician Assistant

## 2022-04-01 ENCOUNTER — Ambulatory Visit (INDEPENDENT_AMBULATORY_CARE_PROVIDER_SITE_OTHER): Payer: BC Managed Care – PPO | Admitting: Physician Assistant

## 2022-04-01 VITALS — BP 113/76 | HR 76 | Temp 98.8°F | Ht 74.0 in | Wt 326.0 lb

## 2022-04-01 DIAGNOSIS — R7303 Prediabetes: Secondary | ICD-10-CM

## 2022-04-01 DIAGNOSIS — E559 Vitamin D deficiency, unspecified: Secondary | ICD-10-CM

## 2022-04-01 DIAGNOSIS — E669 Obesity, unspecified: Secondary | ICD-10-CM

## 2022-04-01 DIAGNOSIS — Z6841 Body Mass Index (BMI) 40.0 and over, adult: Secondary | ICD-10-CM

## 2022-04-01 DIAGNOSIS — F3289 Other specified depressive episodes: Secondary | ICD-10-CM | POA: Diagnosis not present

## 2022-04-01 MED ORDER — BUPROPION HCL ER (SR) 150 MG PO TB12: 150.0000 mg | ORAL_TABLET | Freq: Every day | ORAL | 0 refills | Status: DC

## 2022-04-01 MED ORDER — VITAMIN D (ERGOCALCIFEROL) 1.25 MG (50000 UNIT) PO CAPS: 50000.0000 [IU] | ORAL_CAPSULE | ORAL | 0 refills | Status: DC

## 2022-04-06 ENCOUNTER — Ambulatory Visit (INDEPENDENT_AMBULATORY_CARE_PROVIDER_SITE_OTHER): Payer: BC Managed Care – PPO | Admitting: Family Medicine

## 2022-04-13 NOTE — Progress Notes (Unsigned)
326 lbs    Chief Complaint:   OBESITY Kevin [name] is here to discuss his progress with his obesity treatment plan along with follow-up of his obesity related diagnoses. Kevin [name] is on the Category 4 Plan and states he is following his eating plan approximately 60% of the time. Kevin [name] states he is exercising 0 minutes 0 times per week.  Today's visit was #: 16 Starting weight: 349 lbs Starting date: 01/21/2021 Today's weight: 326 lbs Today's date: 04/01/2022 Total lbs lost to date: 23 lbs Total lbs lost since last in-office visit: 0  Interim History: Kevin [name] has done well with weight loss overall. Was more mindful/making better choices over Thanksgiving. Some increased stress at work lately and doing some grab and go or missing meals.   Subjective:   1. Pre-diabetes Kevin [name] is taking Metformin 500 mg daily--Denies any side effects.  2. Vitamin D deficiency Kevin [name] is currently taking prescription Vit D 50,000 IU once a week. Denies any nausea, vomiting or muscle weakness.  3. Other depression, emotional eating behavior Kevin [name] is taking Wellbutrin --Denies any side effects.  Assessment/Plan:   1. Pre-diabetes Continue Metformin. Continue healthy eating plan and exercise.  2. Vitamin D deficiency We will refill Vit D 50K IU once a week for 1 month with 0 refills.  -Refill Vitamin D, Ergocalciferol, (DRISDOL) 1.25 MG (50000 UNIT) CAPS capsule; Take 1 capsule (50,000 Units total) by mouth every 7 (seven) days.  Dispense: 4 capsule; Refill: 0  3. Other depression, emotional eating behavior We will refill Wellbutrin SR 150 mg daily for 1 month with 0 refills.  -Refill buPROPion (WELLBUTRIN SR) 150 MG 12 hr tablet; Take 1 tablet (150 mg total) by mouth daily.  Dispense: 30 tablet; Refill: 0  4. Obesity, Current BMI 41.9 Kevin [name] is currently in the action stage of change. As such, his goal is to continue with weight loss efforts. He has agreed to the Category 4 Plan.  Provided  Dining out information.  Exercise goals: As is.  Behavioral modification strategies: increasing lean protein intake, decreasing simple carbohydrates, no skipping meals, and holiday eating strategies .  Kevin [name] has agreed to follow-up with our clinic in 3 weeks. He was informed of the importance of frequent follow-up visits to maximize his success with intensive lifestyle modifications for his multiple health conditions.   Objective:   Blood pressure 113/76, pulse 76, temperature 98.8 F (37.1 C), height 6\' 2"  (1.88 m), weight (!) 326 lb (147.9 kg), SpO2 96 %. Body mass index is 41.86 kg/m.  General: Cooperative, alert, well developed, in no acute distress. HEENT: Conjunctivae and lids unremarkable. Cardiovascular: Regular rhythm.  Lungs: Normal work of breathing. Neurologic: No focal deficits.   Lab Results  Component Value Date   CREATININE 1.01 07/06/2021   BUN 11 07/06/2021   NA 139 07/06/2021   K 3.8 07/06/2021   CL 100 07/06/2021   CO2 24 07/06/2021   Lab Results  Component Value Date   ALT 32 07/06/2021   AST 23 07/06/2021   ALKPHOS 97 07/06/2021   BILITOT 0.8 07/06/2021   Lab Results  Component Value Date   HGBA1C 6.1 (H) 07/06/2021   HGBA1C 6.2 (H) 01/21/2021   Lab Results  Component Value Date   INSULIN 25.8 (H) 07/06/2021   INSULIN 30.7 (H) 01/21/2021   Lab Results  Component Value Date   TSH 2.650 01/21/2021   Lab Results  Component Value Date   CHOL 148 01/21/2021   HDL 49 01/21/2021   LDLCALC 79 01/21/2021  TRIG 110 01/21/2021   Lab Results  Component Value Date   VD25OH 39.9 07/06/2021   VD25OH 26.4 (L) 01/21/2021   Lab Results  Component Value Date   WBC 4.8 01/21/2021   HGB 14.9 01/21/2021   HCT 44.5 01/21/2021   MCV 91 01/21/2021   PLT 239 01/21/2021   No results found for: "IRON", "TIBC", "FERRITIN"  Attestation Statements:   Reviewed by clinician on day of visit: allergies, medications, problem list, medical history,  surgical history, family history, social history, and previous encounter notes.  I, Brendell Tyus, am acting as transcriptionist for [employer], [employer].  I have reviewed the above documentation for accuracy and completeness, and I agree with the above. -  ***

## 2022-04-22 ENCOUNTER — Ambulatory Visit (INDEPENDENT_AMBULATORY_CARE_PROVIDER_SITE_OTHER): Payer: BC Managed Care – PPO | Admitting: Physician Assistant

## 2022-04-22 ENCOUNTER — Encounter (INDEPENDENT_AMBULATORY_CARE_PROVIDER_SITE_OTHER): Payer: Self-pay | Admitting: Physician Assistant

## 2022-04-22 VITALS — BP 123/80 | HR 78 | Temp 98.5°F | Ht 74.0 in | Wt 327.0 lb

## 2022-04-22 DIAGNOSIS — R7303 Prediabetes: Secondary | ICD-10-CM

## 2022-04-22 DIAGNOSIS — Z6841 Body Mass Index (BMI) 40.0 and over, adult: Secondary | ICD-10-CM

## 2022-04-22 DIAGNOSIS — E559 Vitamin D deficiency, unspecified: Secondary | ICD-10-CM

## 2022-04-22 DIAGNOSIS — E669 Obesity, unspecified: Secondary | ICD-10-CM

## 2022-04-22 DIAGNOSIS — E66813 Obesity, class 3: Secondary | ICD-10-CM

## 2022-04-22 DIAGNOSIS — F3289 Other specified depressive episodes: Secondary | ICD-10-CM | POA: Diagnosis not present

## 2022-04-22 MED ORDER — VITAMIN D (ERGOCALCIFEROL) 1.25 MG (50000 UNIT) PO CAPS: 50000.0000 [IU] | ORAL_CAPSULE | ORAL | 0 refills | Status: DC

## 2022-04-22 MED ORDER — BUPROPION HCL ER (SR) 150 MG PO TB12: 150.0000 mg | ORAL_TABLET | Freq: Every day | ORAL | 0 refills | Status: DC

## 2022-04-29 DIAGNOSIS — G4733 Obstructive sleep apnea (adult) (pediatric): Secondary | ICD-10-CM | POA: Diagnosis not present

## 2022-05-12 NOTE — Progress Notes (Signed)
Chief Complaint:   OBESITY Kevin [name] is here to discuss his progress with his obesity treatment plan along with follow-up of his obesity related diagnoses. Kevin [name] is on the Category 4 Plan and states he is following his eating plan approximately 50% of the time. Kevin [name] states he is exercising 0 minutes 0 times per week.  Today's visit was #: 29 Starting weight: 349 lbs Starting date: 01/21/2021 Today's weight: 327 lbs Today's date: 04/22/2022 Total lbs lost to date: 22 lbs Total lbs lost since last in-office visit: 0  Interim History: Kevin [name] has done well with weight loss overall.  He remains very busy with work and continues to do some grabbing go but was more mindful better choices.  Trying to focus on getting more protein when eating out.   Bioimepdance scale muscle mass increase to 0.8 lbs. Adipose mass no change. Water increased to 0.4 lb.  Subjective:   1. Pre-diabetes Kevin [name] continues to take metformin 500 mg daily.  Mild GI upset, otherwise no side effects.  Last A1c at 6.1/insulin at 25.8 on 07/06/21-not at goal.  2. Vitamin D deficiency Kevin [name] is taking ergocalciferol once weekly-no side effects.  Last vitamin D of 39.9 on 07/06/21-not at goal.  3. Other depression, emotional eating behavior On Wellbutrin SR 150 mg Daily. Kevin [name] feels Wellbutrin may be helping to decrease cravings-no side effects.  Assessment/Plan:   1. Pre-diabetes Continue metformin 500 mg daily. Continue prescribed nutrition plan to decrease simple carbohydrates, increase lean proteins and exercise to promote weight loss.  Check labs at next visit.  2. Vitamin D deficiency We will refill Vit D 50K IU once a week for 1 month with 0 refills. Continue Ergocalciferol once weekly. Check labs at next visit.  -Refill Vitamin D, Ergocalciferol, (DRISDOL) 1.25 MG (50000 UNIT) CAPS capsule; Take 1 capsule (50,000 Units total) by mouth every 7 (seven) days.  Dispense: 4 capsule; Refill: 0  3. Other  depression, emotional eating behavior We will continue/refill Wellbutrin SR 150 mg daily for 3 months with 0 refills. Continue healthy eating plan and exercise to promote weight loss. Continue Wellbutrin for cravings/emotional eating behavior.   -Refill buPROPion (WELLBUTRIN SR) 150 MG 12 hr tablet; Take 1 tablet (150 mg total) by mouth daily.  Dispense: 90 tablet; Refill: 0  4. Obesity, Current BMI 42.0 Kevin [name] is currently in the action stage of change. As such, his goal is to continue with weight loss efforts. He has agreed to the Category 4 Plan and keeping a food journal and adhering to recommended goals of 1800-2000 calories and 130+grams of protein daily.   Handout given: Increased protein choices.  Exercise goals: As is.  Behavioral modification strategies: increasing lean protein intake, decreasing simple carbohydrates, meal planning and cooking strategies, better snacking choices, emotional eating strategies, and holiday eating strategies .  Kevin [name] has agreed to follow-up with our clinic in 4 weeks. He was informed of the importance of frequent follow-up visits to maximize his success with intensive lifestyle modifications for his multiple health conditions.   Objective:   Blood pressure 123/80, pulse 78, temperature 98.5 F (36.9 C), height 6\' 2"  (1.88 m), weight (!) 327 lb (148.3 kg), SpO2 100 %. Body mass index is 41.98 kg/m.  General: Cooperative, alert, well developed, in no acute distress. HEENT: Conjunctivae and lids unremarkable. Cardiovascular: Regular rhythm.  Lungs: Normal work of breathing. Neurologic: No focal deficits.   Lab Results  Component Value Date   CREATININE 1.01 07/06/2021   BUN 11 07/06/2021  NA 139 07/06/2021   K 3.8 07/06/2021   CL 100 07/06/2021   CO2 24 07/06/2021   Lab Results  Component Value Date   ALT 32 07/06/2021   AST 23 07/06/2021   ALKPHOS 97 07/06/2021   BILITOT 0.8 07/06/2021   Lab Results  Component Value Date    HGBA1C 6.1 (H) 07/06/2021   HGBA1C 6.2 (H) 01/21/2021   Lab Results  Component Value Date   INSULIN 25.8 (H) 07/06/2021   INSULIN 30.7 (H) 01/21/2021   Lab Results  Component Value Date   TSH 2.650 01/21/2021   Lab Results  Component Value Date   CHOL 148 01/21/2021   HDL 49 01/21/2021   LDLCALC 79 01/21/2021   TRIG 110 01/21/2021   Lab Results  Component Value Date   VD25OH 39.9 07/06/2021   VD25OH 26.4 (L) 01/21/2021   Lab Results  Component Value Date   WBC 4.8 01/21/2021   HGB 14.9 01/21/2021   HCT 44.5 01/21/2021   MCV 91 01/21/2021   PLT 239 01/21/2021   No results found for: "IRON", "TIBC", "FERRITIN"  Attestation Statements:   Reviewed by clinician on day of visit: allergies, medications, problem list, medical history, surgical history, family history, social history, and previous encounter notes.  I, Brendell Tyus, am acting as transcriptionist for [employer], [employer].  I have reviewed the above documentation for accuracy and completeness, and I agree with the above. -  Paiton Fosco,PA-C

## 2022-05-27 ENCOUNTER — Encounter (INDEPENDENT_AMBULATORY_CARE_PROVIDER_SITE_OTHER): Payer: Self-pay | Admitting: Physician Assistant

## 2022-05-27 ENCOUNTER — Ambulatory Visit (INDEPENDENT_AMBULATORY_CARE_PROVIDER_SITE_OTHER): Payer: BC Managed Care – PPO | Admitting: Physician Assistant

## 2022-05-27 VITALS — BP 125/80 | HR 76 | Temp 98.1°F | Ht 74.0 in | Wt 332.0 lb

## 2022-05-27 DIAGNOSIS — E7849 Other hyperlipidemia: Secondary | ICD-10-CM | POA: Diagnosis not present

## 2022-05-27 DIAGNOSIS — E669 Obesity, unspecified: Secondary | ICD-10-CM | POA: Diagnosis not present

## 2022-05-27 DIAGNOSIS — Z6841 Body Mass Index (BMI) 40.0 and over, adult: Secondary | ICD-10-CM

## 2022-05-27 DIAGNOSIS — E559 Vitamin D deficiency, unspecified: Secondary | ICD-10-CM

## 2022-05-27 DIAGNOSIS — R7303 Prediabetes: Secondary | ICD-10-CM

## 2022-05-27 MED ORDER — VITAMIN D (ERGOCALCIFEROL) 1.25 MG (50000 UNIT) PO CAPS: 50000.0000 [IU] | ORAL_CAPSULE | ORAL | 0 refills | Status: DC

## 2022-05-27 MED ORDER — METFORMIN HCL 500 MG PO TABS: ORAL_TABLET | ORAL | 0 refills | Status: DC

## 2022-05-27 NOTE — Progress Notes (Signed)
PATIENT: Kevin [name] DOB: 12/07/1970  REASON FOR VISIT: follow up HISTORY FROM: patient  Virtual Visit via Telephone Note  I connected with Kevin [name] on 06/01/22 at  8:00 AM EST by telephone and verified that I am speaking with the correct person using two identifiers.   I discussed the limitations, risks, security and privacy concerns of performing an evaluation and management service by telephone and the availability of in person appointments. I also discussed with the patient that there may be a patient responsible charge related to this service. The patient expressed understanding and agreed to proceed.   History of Present Illness:  06/01/22 ALL: Kevin [name] is a 52 y.o. male here today for follow up for OSA on CPAP. He is adjusting well to CPAP therapy. He is using his machine every night for 7-8 hours. He does have some discomfort of ears when having allergy symptoms. He has not changed out mask recently dure to cost. He is planning to order supplies online. He has not been bothered by leak. He is feeling well and without concerns.     History (copied from Dr Guadelupe Sabin previous note)  Kevin [name] is a 52 year old right-handed gentleman with an underlying medical history of hypertension, hyperlipidemia, fatty liver, shortness of breath, and severe obesity with a BMI of over 40, who Who presents for follow-up consultation of his obstructive sleep apnea after interim testing and starting CPAP therapy.  The patient is unaccompanied today.  I first met him at the request of Dr. Jearld Shines on 07/01/2021, at which time he reported snoring and excessive daytime somnolence as well as witnessed apneas.  He was advised to proceed with a sleep study.  He had a laboratory attended sleep study on 08/04/2021 and qualified for an emergency split-night study due to severe sleep apnea.  Baseline sleep efficiency was 66%, sleep latency 70 minutes, REM latency 65 minutes.  Overall AHI was 53.8/h,  average oxygen saturation was 94%, nadir was 66% during supine REM sleep.  He was placed on CPAP therapy via Evora fullface mask from [employer], size large centimeters, titrated to a final pressure of 16 cm, at which time his AHI was 15.8/h, O2 nadir 85% with supine non-REM sleep achieved.  Based on his test results I prescribed home BiPAP therapy empirically but the insurance did not cover BiPAP therapy.  I prescribed home CPAP therapy at 16 centimeters.  He has a ResMed AirSense 10 AutoSet machine, set up date was 08/28/2021.   Today, 11/09/2021: I reviewed his CPAP compliance data from 10/06/2021 through 11/04/2021, which is a total of 30 days, during which time he used his machine 29 days with percent use days greater than 4 hours at 97%, indicating excellent compliance with an average usage of 7 hours and 30 minutes, residual AHI at goal at 1.9/h, leak on the higher side with the 95th percentile at 28.5 L/min on a pressure of 16 cm with EPR of 3.  He reports doing fairly well with his CPAP, did have initial adjustment difficulty especially with the higher pressure, he had dry mouth and sore throat fairly consistently in the first week or so.  He tolerates the full facemask but is a side sleeper and sometimes notices the mask dislodging.  He just returned from a trip to [place] with the [employer] and took his machine with him.  He is very motivated to continue with treatment, snoring is much better per wife's feedback and nocturia and middle of  the night awakenings have improved also. Of note, he has been working on weight loss and has lost a good 10 pounds since our first visit. Has had some weight fluctuations, have gone down to 324 pounds for his lowest in the recent past.   Observations/Objective:  Generalized: Well developed, in no acute distress  Mentation: Alert oriented to time, place, history taking. Follows all commands speech and language fluent   Assessment and Plan:  52 y.o. year old male   has a past medical history of Fatty liver, Hyperlipidemia, and Hypertension. here with    ICD-10-CM   1. OSA on CPAP  G47.33 For home use only DME continuous positive airway pressure (CPAP)      Kevin [name] is doing well. Compliance report shows excellent compliance. He was encouraged to continue using CPAP nightly for at least 4 hours. He will change supplies as directed. Monitor for leak at home. Supply orders updated. May order online if cost is a concern. Healthy lifestyle habits encouraged. He will follow up in 1 year, sooner if needed.   Orders Placed This Encounter  Procedures   For home use only DME continuous positive airway pressure (CPAP)    Supplies    Order Specific Question:   Length of Need    Answer:   Lifetime    Order Specific Question:   Patient has OSA or probable OSA    Answer:   Yes    Order Specific Question:   Is the patient currently using CPAP in the home    Answer:   Yes    Order Specific Question:   Settings    Answer:   Other see comments    Order Specific Question:   CPAP supplies needed    Answer:   Mask, headgear, cushions, filters, heated tubing and water chamber    No orders of the defined types were placed in this encounter.    Follow Up Instructions:  I discussed the assessment and treatment plan with the patient. The patient was provided an opportunity to ask questions and all were answered. The patient agreed with the plan and demonstrated an understanding of the instructions.   The patient was advised to call back or seek an in-person evaluation if the symptoms worsen or if the condition fails to improve as anticipated.  I provided 15 minutes of non-face-to-face time during this encounter. Patient located at their place of residence during [place] visit. Provider is in the office.    Debbora Presto, NP

## 2022-05-27 NOTE — Patient Instructions (Signed)

## 2022-05-30 DIAGNOSIS — G4733 Obstructive sleep apnea (adult) (pediatric): Secondary | ICD-10-CM | POA: Diagnosis not present

## 2022-06-01 ENCOUNTER — Telehealth: Payer: BC Managed Care – PPO | Admitting: Family Medicine

## 2022-06-01 ENCOUNTER — Encounter: Payer: Self-pay | Admitting: Family Medicine

## 2022-06-01 DIAGNOSIS — G4733 Obstructive sleep apnea (adult) (pediatric): Secondary | ICD-10-CM

## 2022-06-08 NOTE — Progress Notes (Signed)
Chief Complaint:   OBESITY Kevin [name] is here to discuss his progress with his obesity treatment plan along with follow-up of his obesity related diagnoses. Kevin [name] is on the Category 4 Plan and states he is following his eating plan approximately 60% of the time. Kevin [name] states he is exercising 0 minutes 0 times per week.  Today's visit was #: 18 Starting weight: 349 lbs Starting date: 01/21/2021 Today's weight: 332 lbs Today's date: 05/27/2022 Total lbs lost to date: 17 lbs Total lbs lost since last in-office visit: 0  Interim History: Kevin [name] has done well with weight loss overall. He noted some increased stress with his new position at work and has been struggling with meal planning and prepping lately.  He also struggled with a URI, but is feeling better now, but notes his energy level has not yet returned to normal.  He is working to get back on track with his nutrition plan.   Subjective:   1. Pre-diabetes Labs on 7/23 at [place] A1C- 5.7.  On Metformin 500 mg daily.  Mild stomach upset if does not eat quickly.  Working on decreasing simple carbs, increasing lean protein and exercise to promote weight loss and improve glycemic control/prevent development of Type 2 diabetes.   2. Vitamin D deficiency Kevin [name] is on ergocalciferol 50,000 IU once weekly.  Denies any side effects.  3. Other hyperlipidemia Kevin [name] is on Lipitor 20 mg daily.  Last fasting labs at [facility] and I do not have access to result today.  Denies any side effects with Lipitor.  He is working to decrease sat fat/decrease cholesterol in his diet.    Assessment/Plan:   1. Pre-diabetes Continue/Refill Metformin 500 mg daily for 3 months with 0 refills.  Continue Prescribed Nutrition Plan and exercise to promote weight loss/improve glycemic control and prevent development of Type 2 diabetes.  -Refill metFORMIN (GLUCOPHAGE) 500 MG tablet; TAKE 1 TABLET BY MOUTH EVERY DAY WITH BREAKFAST  Dispense:  90 tablet; Refill: 0  2. Vitamin D deficiency Continue/Refill ergocalciferol once a week for 1 month with 0 refills.  -Refill Vitamin D, Ergocalciferol, (DRISDOL) 1.25 MG (50000 UNIT) CAPS capsule; Take 1 capsule (50,000 Units total) by mouth every 7 (seven) days.  Dispense: 4 capsule; Refill: 0  3. Other hyperlipidemia Will check labs at next visit.  Continue medication and Continue Prescribed Nutrition Plan and exercise to promote weight loss and improve lipid profile.  4. Obesity, Current BMI 42.7 Kevin [name] is currently in the action stage of change. As such, his goal is to continue with weight loss efforts. He has agreed to the Category 4 Plan.   Exercise goals: As is.  Behavioral modification strategies: increasing lean protein intake, decreasing simple carbohydrates, meal planning and cooking strategies, and keeping a strict food journal.  Kevin [name] has agreed to follow-up with our clinic in 4 weeks. He was informed of the importance of frequent follow-up visits to maximize his success with intensive lifestyle modifications for his multiple health conditions.   Objective:   Blood pressure 125/80, pulse 76, temperature 98.1 F (36.7 C), height 6' 2"$  (1.88 m), weight (!) 332 lb (150.6 kg), SpO2 94 %. Body mass index is 42.63 kg/m.  General: Cooperative, alert, well developed, in no acute distress. HEENT: Conjunctivae and lids unremarkable. Cardiovascular: Regular rhythm.  Lungs: Normal work of breathing. Neurologic: No focal deficits.   Lab Results  Component Value Date   CREATININE 1.01 07/06/2021   BUN 11 07/06/2021   NA 139 07/06/2021  K 3.8 07/06/2021   CL 100 07/06/2021   CO2 24 07/06/2021   Lab Results  Component Value Date   ALT 32 07/06/2021   AST 23 07/06/2021   ALKPHOS 97 07/06/2021   BILITOT 0.8 07/06/2021   Lab Results  Component Value Date   HGBA1C 6.1 (H) 07/06/2021   HGBA1C 6.2 (H) 01/21/2021   Lab Results  Component Value Date   INSULIN 25.8 (H)  07/06/2021   INSULIN 30.7 (H) 01/21/2021   Lab Results  Component Value Date   TSH 2.650 01/21/2021   Lab Results  Component Value Date   CHOL 148 01/21/2021   HDL 49 01/21/2021   LDLCALC 79 01/21/2021   TRIG 110 01/21/2021   Lab Results  Component Value Date   VD25OH 39.9 07/06/2021   VD25OH 26.4 (L) 01/21/2021   Lab Results  Component Value Date   WBC 4.8 01/21/2021   HGB 14.9 01/21/2021   HCT 44.5 01/21/2021   MCV 91 01/21/2021   PLT 239 01/21/2021   No results found for: "IRON", "TIBC", "FERRITIN"  Attestation Statements:   Reviewed by clinician on day of visit: allergies, medications, problem list, medical history, surgical history, family history, social history, and previous encounter notes.  I, Brendell Tyus, am acting as transcriptionist for [employer], [employer].  I have reviewed the above documentation for accuracy and completeness, and I agree with the above. -  Lizann Edelman,PA-C

## 2022-06-28 ENCOUNTER — Ambulatory Visit (INDEPENDENT_AMBULATORY_CARE_PROVIDER_SITE_OTHER): Payer: BC Managed Care – PPO | Admitting: Physician Assistant

## 2022-06-28 ENCOUNTER — Encounter (INDEPENDENT_AMBULATORY_CARE_PROVIDER_SITE_OTHER): Payer: Self-pay | Admitting: Physician Assistant

## 2022-06-28 VITALS — BP 128/71 | HR 71 | Temp 98.6°F | Ht 74.0 in | Wt 332.4 lb

## 2022-06-28 DIAGNOSIS — E7849 Other hyperlipidemia: Secondary | ICD-10-CM

## 2022-06-28 DIAGNOSIS — R7303 Prediabetes: Secondary | ICD-10-CM

## 2022-06-28 DIAGNOSIS — E669 Obesity, unspecified: Secondary | ICD-10-CM

## 2022-06-28 DIAGNOSIS — E559 Vitamin D deficiency, unspecified: Secondary | ICD-10-CM | POA: Diagnosis not present

## 2022-06-28 DIAGNOSIS — F3289 Other specified depressive episodes: Secondary | ICD-10-CM

## 2022-06-28 DIAGNOSIS — R5383 Other fatigue: Secondary | ICD-10-CM

## 2022-06-28 DIAGNOSIS — Z6841 Body Mass Index (BMI) 40.0 and over, adult: Secondary | ICD-10-CM

## 2022-06-28 MED ORDER — VITAMIN D (ERGOCALCIFEROL) 1.25 MG (50000 UNIT) PO CAPS: 50000.0000 [IU] | ORAL_CAPSULE | ORAL | 0 refills | Status: DC

## 2022-06-28 NOTE — Progress Notes (Signed)
Chief Complaint:   OBESITY Kevin [name] is here to discuss his progress with his obesity treatment plan along with follow-up of his obesity related diagnoses. Kevin [name] is on the Category 4 Plan and states he is following his eating plan approximately 70% of the time. Kevin [name] states he is walking  10-15 minutes 5 times per week.  Today's visit was #: 64 Starting weight: 349 lbs Starting date: 01/21/2021 Today's weight: 332 lbs Today's date: 06/28/2022 Total lbs lost to date: 17 lbs Total lbs lost since last in-office visit: 0  Interim History: Kevin [name] has done well with weight loss.  He just celebrated his 73th Birthday with family and friends! He continues to be very busy with a new position at work and with Kevin [name] events and has an event this weekend.  He feels his biggest challenge is having time to exercise. We discussed doing some strength training 10 minutes 3 times weekly.    Subjective:   1. Pre-diabetes A1c 5.7. Plan to repeat labs today.  On metformin 500 mg once daily.  No side effects with metformin.  He is working on decreasing simple carbohydrates, increasing lean protein and exercise to promote weight loss and improve glycemic control/prevent progression to type 2 diabetes.  2. Other hyperlipidemia On Lipitor 20 mg once daily.  Plan to repeat labs today.  He is working on decreasing saturated fat and cholesterol on his nutritional plan to promote weight loss and improve lipid profile.  3. Vitamin D deficiency He is on ergocalciferol 50,000 IU once weekly.  No side effects.  Endorses fatigue.  4. Other fatigue Endorses fatigue.  Will recheck labs today.  5. Other depression, emotional eating behavior Taking bupropion SR 150 mg daily.  He is not sure if this is helping much with his cravings.  Will plan to continue for another month and monitor response.  Could increase dosage next visit.  6. Obesity (Kevin [name])- start BMI-46.04 7. BMI 40.0-44.9, adult  (HCC) Assessment/Plan:   1. Pre-diabetes Will recheck labs today.  Continue on metformin 500 mg once daily.  Continue to work on decreasing simple carbohydrates, increasing lean protein and exercise to promote weight loss, improve glycemic control and decrease risk of progression to type 2 diabetes. - CMP14+EGFR - Hemoglobin A1c - Insulin, random  2. Other hyperlipidemia Continue nutrition plan to decrease saturated fat and cholesterol to promote weight loss and improve lipid profile.  Recheck labs today - Lipid Panel With LDL/HDL Ratio  3. Vitamin D deficiency Continue ergocalciferol 50,000 IU weekly.  Recheck vitamin D level today - Vitamin D, Ergocalciferol, (DRISDOL) 1.25 MG (50000 UNIT) CAPS capsule; Take 1 capsule (50,000 Units total) by mouth every 7 (seven) days.  Dispense: 4 capsule; Refill: 0 - VITAMIN D 25 Hydroxy (Vit-D Deficiency, Fractures)  4. Other fatigue .  Endorses fatigue and will recheck CBC TSH and B12 as well today. - CBC with Differential/Platelet - TSH - Vitamin B12  5. Other depression, emotional eating behavior Will continue Wellbutrin 150 mg once daily, could increase that next visit if he does not feel it is helpful or potentially discontinue depending on response.  He will continue to work on strategies for emotional eating.  6. Obesity (Kevin [name])- start BMI-46.04   7. BMI 40.0-44.9, adult [facility])   Kevin [name] is currently in the action stage of change. As such, his goal is to continue with weight loss efforts. He has agreed to the Category 4 Plan.   Exercise goals: All adults should avoid inactivity.  Some physical activity is better than none, and adults who participate in any amount of physical activity gain some health benefits. Adults should also include muscle-strengthening activities that involve all major muscle groups on 2 or more days a week.  Behavioral modification strategies: increasing lean protein intake, decreasing simple carbohydrates, ways to  avoid night time snacking, and emotional eating strategies.  Kevin [name] has agreed to follow-up with our clinic in 4 weeks. He was informed of the importance of frequent follow-up visits to maximize his success with intensive lifestyle modifications for his multiple health conditions.   Objective:   Blood pressure 128/71, pulse 71, temperature 98.6 F (37 C), height '6\' 2"'$  (1.88 m), weight (!) 332 lb 6.4 oz (150.8 kg), SpO2 96 %. Body mass index is 42.68 kg/m.  General: Cooperative, alert, well developed, in no acute distress. HEENT: Conjunctivae and lids unremarkable. Cardiovascular: Regular rhythm.  Lungs: Normal work of breathing. Neurologic: No focal deficits.   Lab Results  Component Value Date   CREATININE 1.01 07/06/2021   BUN 11 07/06/2021   NA 139 07/06/2021   K 3.8 07/06/2021   CL 100 07/06/2021   CO2 24 07/06/2021   Lab Results  Component Value Date   ALT 32 07/06/2021   AST 23 07/06/2021   ALKPHOS 97 07/06/2021   BILITOT 0.8 07/06/2021   Lab Results  Component Value Date   HGBA1C 6.1 (H) 07/06/2021   HGBA1C 6.2 (H) 01/21/2021   Lab Results  Component Value Date   INSULIN 25.8 (H) 07/06/2021   INSULIN 30.7 (H) 01/21/2021   Lab Results  Component Value Date   TSH 2.650 01/21/2021   Lab Results  Component Value Date   CHOL 148 01/21/2021   HDL 49 01/21/2021   LDLCALC 79 01/21/2021   TRIG 110 01/21/2021   Lab Results  Component Value Date   VD25OH 39.9 07/06/2021   VD25OH 26.4 (L) 01/21/2021   Lab Results  Component Value Date   WBC 4.8 01/21/2021   HGB 14.9 01/21/2021   HCT 44.5 01/21/2021   MCV 91 01/21/2021   PLT 239 01/21/2021   No results found for: "IRON", "TIBC", "FERRITIN"  Obesity Behavioral Intervention:   Approximately 15 minutes were spent on the discussion below.  ASK: We discussed the diagnosis of obesity with Kevin [name] today and Kevin [name] agreed to give [place] permission to discuss obesity behavioral modification therapy  today.  ASSESS: Kevin [name] has the diagnosis of obesity and his BMI today is 42.7. Kevin [name] is in the action stage of change.   ADVISE: Kevin [name] was educated on the multiple health risks of obesity as well as the benefit of weight loss to improve his health. He was advised of the need for long term treatment and the importance of lifestyle modifications to improve his current health and to decrease his risk of future health problems.  AGREE: Multiple dietary modification options and treatment options were discussed and Esdras agreed to follow the recommendations documented in the above note.  ARRANGE: Theophile was educated on the importance of frequent visits to treat obesity as outlined per CMS and USPSTF guidelines and agreed to schedule his next follow up appointment today.  Attestation Statements:   Reviewed by clinician on day of visit: allergies, medications, problem list, medical history, surgical history, family history, social history, and previous encounter notes.  Korina Tretter,PA-C

## 2022-06-29 LAB — CMP14+EGFR
ALT: 45 IU/L — ABNORMAL HIGH (ref 0–44)
AST: 22 IU/L (ref 0–40)
Albumin/Globulin Ratio: 1.7 (ref 1.2–2.2)
Albumin: 4.5 g/dL (ref 4.1–5.1)
Alkaline Phosphatase: 97 IU/L (ref 44–121)
BUN/Creatinine Ratio: 16 (ref 9–20)
BUN: 17 mg/dL (ref 6–24)
Bilirubin Total: 0.4 mg/dL (ref 0.0–1.2)
CO2: 23 mmol/L (ref 20–29)
Calcium: 9.5 mg/dL (ref 8.7–10.2)
Chloride: 101 mmol/L (ref 96–106)
Creatinine, Ser: 1.06 mg/dL (ref 0.76–1.27)
Globulin, Total: 2.6 g/dL (ref 1.5–4.5)
Glucose: 114 mg/dL — ABNORMAL HIGH (ref 70–99)
Potassium: 4.2 mmol/L (ref 3.5–5.2)
Sodium: 140 mmol/L (ref 134–144)
Total Protein: 7.1 g/dL (ref 6.0–8.5)
eGFR: 85 mL/min/{1.73_m2} (ref >59)

## 2022-06-29 LAB — CBC WITH DIFFERENTIAL/PLATELET
Basophils Absolute: 0.1 10*3/uL (ref 0.0–0.2)
Basos: 1 %
EOS (ABSOLUTE): 0.2 10*3/uL (ref 0.0–0.4)
Eos: 3 %
Hematocrit: 44.9 % (ref 37.5–51.0)
Hemoglobin: 15.3 g/dL (ref 13.0–17.7)
Immature Grans (Abs): 0 10*3/uL (ref 0.0–0.1)
Immature Granulocytes: 0 %
Lymphocytes Absolute: 2.2 10*3/uL (ref 0.7–3.1)
Lymphs: 34 %
MCH: 30.8 pg (ref 26.6–33.0)
MCHC: 34.1 g/dL (ref 31.5–35.7)
MCV: 91 fL (ref 79–97)
Monocytes Absolute: 0.8 10*3/uL (ref 0.1–0.9)
Monocytes: 12 %
Neutrophils Absolute: 3.3 10*3/uL (ref 1.4–7.0)
Neutrophils: 50 %
Platelets: 280 10*3/uL (ref 150–450)
RBC: 4.96 x10E6/uL (ref 4.14–5.80)
RDW: 13.1 % (ref 11.6–15.4)
WBC: 6.7 10*3/uL (ref 3.4–10.8)

## 2022-06-29 LAB — HEMOGLOBIN A1C
Est. average glucose Bld gHb Est-mCnc: 126 mg/dL
Hgb A1c MFr Bld: 6 % — ABNORMAL HIGH (ref 4.8–5.6)

## 2022-06-29 LAB — VITAMIN B12: Vitamin B-12: 370 pg/mL (ref 232–1245)

## 2022-06-29 LAB — LIPID PANEL WITH LDL/HDL RATIO
Cholesterol, Total: 165 mg/dL (ref 100–199)
HDL: 52 mg/dL (ref >39)
LDL Chol Calc (NIH): 83 mg/dL (ref 0–99)
LDL/HDL Ratio: 1.6 ratio (ref 0.0–3.6)
Triglycerides: 177 mg/dL — ABNORMAL HIGH (ref 0–149)
VLDL Cholesterol Cal: 30 mg/dL (ref 5–40)

## 2022-06-29 LAB — TSH: TSH: 2.75 u[IU]/mL (ref 0.450–4.500)

## 2022-06-29 LAB — VITAMIN D 25 HYDROXY (VIT D DEFICIENCY, FRACTURES): Vit D, 25-Hydroxy: 34.3 ng/mL (ref 30.0–100.0)

## 2022-06-29 LAB — INSULIN, RANDOM: INSULIN: 38.5 u[IU]/mL — ABNORMAL HIGH (ref 2.6–24.9)

## 2022-07-26 ENCOUNTER — Encounter (INDEPENDENT_AMBULATORY_CARE_PROVIDER_SITE_OTHER): Payer: Self-pay | Admitting: Physician Assistant

## 2022-07-26 ENCOUNTER — Ambulatory Visit (INDEPENDENT_AMBULATORY_CARE_PROVIDER_SITE_OTHER): Payer: BC Managed Care – PPO | Admitting: Physician Assistant

## 2022-07-26 VITALS — BP 112/76 | HR 68 | Temp 98.0°F | Ht 74.0 in | Wt 334.0 lb

## 2022-07-26 DIAGNOSIS — Z6841 Body Mass Index (BMI) 40.0 and over, adult: Secondary | ICD-10-CM

## 2022-07-26 DIAGNOSIS — E538 Deficiency of other specified B group vitamins: Secondary | ICD-10-CM

## 2022-07-26 DIAGNOSIS — F3289 Other specified depressive episodes: Secondary | ICD-10-CM

## 2022-07-26 DIAGNOSIS — R7303 Prediabetes: Secondary | ICD-10-CM | POA: Diagnosis not present

## 2022-07-26 DIAGNOSIS — E559 Vitamin D deficiency, unspecified: Secondary | ICD-10-CM | POA: Diagnosis not present

## 2022-07-26 DIAGNOSIS — E669 Obesity, unspecified: Secondary | ICD-10-CM | POA: Insufficient documentation

## 2022-07-26 MED ORDER — VITAMIN D (ERGOCALCIFEROL) 1.25 MG (50000 UNIT) PO CAPS: 50000.0000 [IU] | ORAL_CAPSULE | ORAL | 0 refills | Status: DC

## 2022-07-26 MED ORDER — VITAMIN B 12 500 MCG PO TABS: 500.0000 ug | ORAL_TABLET | Freq: Every day | ORAL | 0 refills | Status: DC

## 2022-07-26 NOTE — Assessment & Plan Note (Signed)
Vitamin D Deficiency Vitamin D is not at goal of 50.  Most recent vitamin D level was 34.3. He is on  prescription ergocalciferol 50,000 IU weekly. Lab Results  Component Value Date   VD25OH 34.3 06/28/2022   VD25OH 39.9 07/06/2021   VD25OH 26.4 (L) 01/21/2021    Plan: Refill prescription vitamin D 50,000 IU weekly. Recheck Vit D level 2-3 times yearly to avoid over supplementation.

## 2022-07-26 NOTE — Assessment & Plan Note (Addendum)
Mood disorder/emotional eating Kevin [name] has had issues with stress/emotional eating. Currently this is moderately controlled. Overall mood is stable. Working on emotional eating strategies.  Medication(s): Bupropion SR 150 mg daily  Plan: Discontinue Bupropion SR 150 mg daily as he does not feel it is helping and would like to discontinue at this point and see how he feels after a couple of weeks. Continue working on emotional eating strategies.

## 2022-07-26 NOTE — Assessment & Plan Note (Addendum)
B 12 in low normal range and discussed increasing B 12 may help with energy, reduce fatigue.  Advised to take B 12 500 mcg once daily. Will plan to recheck B 12 in next 3-4 months.

## 2022-07-26 NOTE — Progress Notes (Signed)
Office: [phone]  /  Fax: [phone]  WEIGHT SUMMARY AND BIOMETRICS  Vitals Temp: 98 F (36.7 C) BP: 112/76 Pulse Rate: 68 SpO2: 97 %   Anthropometric Measurements Height: 6\' 2"  (1.88 m) Weight: (!) 334 lb (151.5 kg) BMI (Calculated): 42.86 Weight at Last Visit: 332 lb Weight Lost Since Last Visit: 0 lb Weight Gained Since Last Visit: 2 lb Starting Weight: 349 lb Total Weight Loss (lbs): 17 lb (7.711 kg)   Body Composition  Body Fat %: 37.5 % Fat Mass (lbs): 125.2 lbs Muscle Mass (lbs): 198.6 lbs Total Body Water (lbs): 141.2 lbs Visceral Fat Rating : 24   Other Clinical Data Fasting: Yes Labs: No Today's Visit #: 20 Starting Date: 01/21/21     HPI  Chief Complaint: OBESITY  Kevin [name] is here to discuss his progress with his obesity treatment plan. He is on the the Category 4 Plan and states he is following his eating plan approximately 60 % of the time. He states he is exercising/walking 45-60 minutes 2 times per week.   Interval History:  Since last office visit he celebrated his 52th birthday with family and friends and had a big cook out! His oldest son graduated from [employer] and will be home until starts his assignment.  His wife has taken a part time job and this has this has added to his busy schedule. He is having to do more cooking and meal planning and prepping . We discussed strategies to help him get back on track and make this easier and more consistent for him.  Pharmacotherapy: metformin for prediabetes/insulin resistance.   PHYSICAL EXAM:  Blood pressure 112/76, pulse 68, temperature 98 F (36.7 C), height 6\' 2"  (1.88 m), weight (!) 334 lb (151.5 kg), SpO2 97 %. Body mass index is 42.88 kg/m.  General: He is overweight, cooperative, alert, well developed, and in no acute distress. PSYCH: Has normal mood, affect and thought process.   Lungs: Normal breathing effort, no conversational dyspnea.  DIAGNOSTIC DATA REVIEWED:  BMET     Component Value Date/Time   NA 140 06/28/2022 1254   K 4.2 06/28/2022 1254   CL 101 06/28/2022 1254   CO2 23 06/28/2022 1254   GLUCOSE 114 (H) 06/28/2022 1254   BUN 17 06/28/2022 1254   CREATININE 1.06 06/28/2022 1254   CALCIUM 9.5 06/28/2022 1254   Lab Results  Component Value Date   HGBA1C 6.0 (H) 06/28/2022   HGBA1C 6.2 (H) 01/21/2021   Lab Results  Component Value Date   INSULIN 38.5 (H) 06/28/2022   INSULIN 30.7 (H) 01/21/2021   Lab Results  Component Value Date   TSH 2.750 06/28/2022   CBC    Component Value Date/Time   WBC 6.7 06/28/2022 1254   RBC 4.96 06/28/2022 1254   HGB 15.3 06/28/2022 1254   HCT 44.9 06/28/2022 1254   PLT 280 06/28/2022 1254   MCV 91 06/28/2022 1254   MCH 30.8 06/28/2022 1254   MCHC 34.1 06/28/2022 1254   RDW 13.1 06/28/2022 1254   Iron Studies No results found for: "IRON", "TIBC", "FERRITIN", "IRONPCTSAT" Lipid Panel     Component Value Date/Time   CHOL 165 06/28/2022 1254   TRIG 177 (H) 06/28/2022 1254   HDL 52 06/28/2022 1254   LDLCALC 83 06/28/2022 1254   Hepatic Function Panel     Component Value Date/Time   PROT 7.1 06/28/2022 1254   ALBUMIN 4.5 06/28/2022 1254   AST 22 06/28/2022 1254   ALT 45 (H)  06/28/2022 1254   ALKPHOS 97 06/28/2022 1254   BILITOT 0.4 06/28/2022 1254      Component Value Date/Time   TSH 2.750 06/28/2022 1254   Nutritional Lab Results  Component Value Date   VD25OH 34.3 06/28/2022   VD25OH 39.9 07/06/2021   VD25OH 26.4 (L) 01/21/2021    ASSOCIATED CONDITIONS ADDRESSED TODAY  ASSESSMENT AND PLAN  Problem List Items Addressed This Visit     Prediabetes - Primary    Prediabetes Last A1c was 6.0  Medication(s): Metformin 500 mg once daily breakfast No side effects with metformin.  Lab Results  Component Value Date   HGBA1C 6.0 (H) 06/28/2022   HGBA1C 6.1 (H) 07/06/2021   HGBA1C 6.2 (H) 01/21/2021   Lab Results  Component Value Date   INSULIN 38.5 (H) 06/28/2022   INSULIN 25.8  (H) 07/06/2021   INSULIN 30.7 (H) 01/21/2021   Plan: Continue working on nutrition plan to decrease simple carbohydrates, increase lean proteins and exercise to promote weight loss, improve glycemic control and prevent progression to Type 2 diabetes.  Continue Metformin 500 mg once daily breakfast       Vitamin D deficiency    Vitamin D Deficiency Vitamin D is not at goal of 50.  Most recent vitamin D level was 34.3. He is on  prescription ergocalciferol 50,000 IU weekly. Lab Results  Component Value Date   VD25OH 34.3 06/28/2022   VD25OH 39.9 07/06/2021   VD25OH 26.4 (L) 01/21/2021   Plan: Refill prescription vitamin D 50,000 IU weekly. Recheck Vit D level 2-3 times yearly to avoid over supplementation.        Relevant Medications   Vitamin D, Ergocalciferol, (DRISDOL) 1.25 MG (50000 UNIT) CAPS capsule   Depression    Mood disorder/emotional eating Kevin [name] has had issues with stress/emotional eating. Currently this is moderately controlled. Overall mood is stable. Working on emotional eating strategies.  Medication(s): Bupropion SR 150 mg daily  Plan: Discontinue Bupropion SR 150 mg daily as he does not feel it is helping and would like to discontinue at this point and see how he feels after a couple of weeks. Continue working on emotional eating strategies.            Low serum vitamin B12    B 12 in low normal range and discussed increasing B 12 may help with energy, reduce fatigue.  Advised to take B 12 500 mcg once daily. Will plan to recheck B 12 in next 3-4 months.       Relevant Medications   Cyanocobalamin (VITAMIN B 12) 500 MCG TABS   Obesity ([place])- start BMI-46.04   BMI 40.0-44.9, adult (HCC) Current BMI 42.9      TREATMENT PLAN FOR OBESITY:  Recommended Dietary Goals  Kevin [name] is currently in the action stage of change. As such, his goal is to continue weight management plan. He has agreed to the Category 4 Plan.  Behavioral Intervention  We  discussed the following Behavioral Modification Strategies today: increasing lean protein intake, decreasing simple carbohydrates , increasing vegetables, increasing water intake, work on meal planning and easy cooking plans, planning for success, and keeping healthy foods at home.  Additional resources provided today: NA  Recommended Physical Activity Goals  Fawkes has been advised to work up to 150 minutes of moderate intensity aerobic activity a week and strengthening exercises 2-3 times per week for cardiovascular health, weight loss maintenance and preservation of muscle mass.   He has agreed to Continue current level of physical  activity    Pharmacotherapy We discussed various medication options to help Ezelle with his weight loss efforts and we both agreed to continue metformin for prediabetes. He does not feel bupropion helped much with cravings and does not want to increase. He is going to stop bupropion for now and see how he feels after a couple of weeks. He does not want to try anything else at this point. .    Return in about 4 weeks (around 08/23/2022).Marland Kitchen He was informed of the importance of frequent follow up visits to maximize his success with intensive lifestyle modifications for his multiple health conditions.   ATTESTASTION STATEMENTS:  Reviewed by clinician on day of visit: allergies, medications, problem list, medical history, surgical history, family history, social history, and previous encounter notes.   I have personally spent 43 minutes total time today in preparation, patient care, nutritional counseling and documentation for this visit, including the following: review of clinical lab tests; review of medical tests/procedures/services.      Kevin Wenker, PA-C

## 2022-07-26 NOTE — Assessment & Plan Note (Signed)
Prediabetes Last A1c was 6.0  Medication(s): Metformin 500 mg once daily breakfast No side effects with metformin.  Lab Results  Component Value Date   HGBA1C 6.0 (H) 06/28/2022   HGBA1C 6.1 (H) 07/06/2021   HGBA1C 6.2 (H) 01/21/2021   Lab Results  Component Value Date   INSULIN 38.5 (H) 06/28/2022   INSULIN 25.8 (H) 07/06/2021   INSULIN 30.7 (H) 01/21/2021    Plan: Continue working on nutrition plan to decrease simple carbohydrates, increase lean proteins and exercise to promote weight loss, improve glycemic control and prevent progression to Type 2 diabetes.  Continue Metformin 500 mg once daily breakfast

## 2022-08-26 ENCOUNTER — Ambulatory Visit (INDEPENDENT_AMBULATORY_CARE_PROVIDER_SITE_OTHER): Payer: BC Managed Care – PPO | Admitting: Physician Assistant

## 2022-08-26 ENCOUNTER — Encounter (INDEPENDENT_AMBULATORY_CARE_PROVIDER_SITE_OTHER): Payer: Self-pay | Admitting: Physician Assistant

## 2022-08-26 VITALS — BP 105/67 | HR 71 | Temp 98.0°F | Ht 74.0 in | Wt 338.0 lb

## 2022-08-26 DIAGNOSIS — E669 Obesity, unspecified: Secondary | ICD-10-CM | POA: Diagnosis not present

## 2022-08-26 DIAGNOSIS — F3289 Other specified depressive episodes: Secondary | ICD-10-CM

## 2022-08-26 DIAGNOSIS — R7303 Prediabetes: Secondary | ICD-10-CM | POA: Diagnosis not present

## 2022-08-26 DIAGNOSIS — Z6841 Body Mass Index (BMI) 40.0 and over, adult: Secondary | ICD-10-CM

## 2022-08-26 DIAGNOSIS — E559 Vitamin D deficiency, unspecified: Secondary | ICD-10-CM

## 2022-08-26 DIAGNOSIS — E538 Deficiency of other specified B group vitamins: Secondary | ICD-10-CM

## 2022-08-26 MED ORDER — VITAMIN B 12 500 MCG PO TABS: 500.0000 ug | ORAL_TABLET | Freq: Every day | ORAL | 0 refills | Status: AC

## 2022-08-26 MED ORDER — VITAMIN D (ERGOCALCIFEROL) 1.25 MG (50000 UNIT) PO CAPS: 50000.0000 [IU] | ORAL_CAPSULE | ORAL | 0 refills | Status: DC

## 2022-08-26 MED ORDER — METFORMIN HCL 500 MG PO TABS: 1000.0000 mg | ORAL_TABLET | Freq: Every day | ORAL | 0 refills | Status: DC

## 2022-08-26 NOTE — Assessment & Plan Note (Signed)
Vitamin D Deficiency Vitamin D is not at goal of 50.  Most recent vitamin D level was 34.3. He is on  prescription ergocalciferol 50,000 IU weekly. No N/V or muscle weakness or other side effects with vitamin D.  Set alarm to remember to take consistently.  Lab Results  Component Value Date   VD25OH 34.3 06/28/2022   VD25OH 39.9 07/06/2021   VD25OH 26.4 (L) 01/21/2021    Plan: Continue and refill  prescription ergocalciferol 50,000 IU weekly Low vitamin D levels can be associated with adiposity and may result in leptin resistance and weight gain. Also associated with fatigue. Currently on vitamin D supplementation without any adverse effects.  Recheck vit d level in 1-2 months.

## 2022-08-26 NOTE — Assessment & Plan Note (Signed)
Prediabetes Last A1c was 6.0/ Insulin 38.5- not at goals.   Medication(s): Metformin 500 mg once daily breakfast No GI side effects with metformin.   He is working on [employer] to decrease simple carbohydrates, increase lean proteins and exercise to promote weight loss, improve glycemic control and prevent progression to Type 2 diabetes.   Lab Results  Component Value Date   HGBA1C 6.0 (H) 06/28/2022   HGBA1C 6.1 (H) 07/06/2021   HGBA1C 6.2 (H) 01/21/2021   Lab Results  Component Value Date   INSULIN 38.5 (H) 06/28/2022   INSULIN 25.8 (H) 07/06/2021   INSULIN 30.7 (H) 01/21/2021    Plan: Continue and increase dose Metformin 500 mg twice daily with meals or metformin 1000 mg once daily if has difficulty with twice daily dosing.  Will plan to recheck A1c, insulin levels over the next 1-2 months.  Continue working on nutrition plan to decrease simple carbohydrates, increase lean proteins and exercise to promote weight loss, improve glycemic control and prevent progression to Type 2 diabetes.

## 2022-08-26 NOTE — Progress Notes (Signed)
Office: [phone]  /  Fax: [phone]  WEIGHT SUMMARY AND BIOMETRICS  Vitals Temp: 98 F (36.7 C) BP: 105/67 Pulse Rate: 71 SpO2: 95 %   Anthropometric Measurements Height:  (1.88 m) Weight: (!) 338 lb (153.3 kg) BMI (Calculated): 43.38 Weight at Last Visit: 334 lb Weight Lost Since Last Visit: 0 Weight Gained Since Last Visit: 4 lb Starting Weight: 349 lb Total Weight Loss (lbs): 11 lb (4.99 kg)   Body Composition  Body Fat %: 38 % Fat Mass (lbs): 128.6 lbs Muscle Mass (lbs): 199.4 lbs Total Body Water (lbs): 144.8 lbs Visceral Fat Rating : 24   Other Clinical Data Labs: no Today's Visit #: 21 Starting Date: 01/21/21     HPI  Chief Complaint: OBESITY  Kevin [name] is here to discuss his progress with his obesity treatment plan. He is on the the Category 4 Plan and states he is following his eating plan approximately 60 % of the time. He states he is not exercising.   Interval History:  Since last office visit he is up 4 lbs.  Stress level- very high- Father is in hospital and mom is also having health concerns.    Wife is traveling out of the country for 10 days for 50th birthday soon. Going to hospital nightly after working and often times picking up dinner and eating very late.  Son is trying to work on his nutrition and they are working on some meal planning/prepping together, but not consistently yet.  He has supplemented with protein shakes in the past and we discussed option of having a [employer] protein shake for dinner and then small protein snack when he gets home from the hospital for the next few weeks.     Pharmacotherapy: metformin for primary indication of prediabetes. No GI side effects with metformin.    Stopped Wellbutrin last month and does not feel it helped with cravings and does not want   To resume Wellbutrin at this time.   PHYSICAL EXAM:  Blood pressure 105/67, pulse 71, temperature 98 F (36.7 C), height  (1.88 m),  weight (!) 338 lb (153.3 kg), SpO2 95 %. Body mass index is 43.4 kg/m.  General: He is overweight, cooperative, alert, well developed, and in no acute distress. PSYCH: Has normal mood, affect and thought process.   Cardiovascular : HR 70's regular Lungs: Normal breathing effort, no conversational dyspnea. Neuro: intact  DIAGNOSTIC DATA REVIEWED:  BMET    Component Value Date/Time   NA 140 06/28/2022 1254   K 4.2 06/28/2022 1254   CL 101 06/28/2022 1254   CO2 23 06/28/2022 1254   GLUCOSE 114 (H) 06/28/2022 1254   BUN 17 06/28/2022 1254   CREATININE 1.06 06/28/2022 1254   CALCIUM 9.5 06/28/2022 1254   Lab Results  Component Value Date   HGBA1C 6.0 (H) 06/28/2022   HGBA1C 6.2 (H) 01/21/2021   Lab Results  Component Value Date   INSULIN 38.5 (H) 06/28/2022   INSULIN 30.7 (H) 01/21/2021   Lab Results  Component Value Date   TSH 2.750 06/28/2022   CBC    Component Value Date/Time   WBC 6.7 06/28/2022 1254   RBC 4.96 06/28/2022 1254   HGB 15.3 06/28/2022 1254   HCT 44.9 06/28/2022 1254   PLT 280 06/28/2022 1254   MCV 91 06/28/2022 1254   MCH 30.8 06/28/2022 1254   MCHC 34.1 06/28/2022 1254   RDW 13.1 06/28/2022 1254   Iron Studies No results found for: "IRON", "  TIBC", "FERRITIN", "IRONPCTSAT" Lipid Panel     Component Value Date/Time   CHOL 165 06/28/2022 1254   TRIG 177 (H) 06/28/2022 1254   HDL 52 06/28/2022 1254   LDLCALC 83 06/28/2022 1254   Hepatic Function Panel     Component Value Date/Time   PROT 7.1 06/28/2022 1254   ALBUMIN 4.5 06/28/2022 1254   AST 22 06/28/2022 1254   ALT 45 (H) 06/28/2022 1254   ALKPHOS 97 06/28/2022 1254   BILITOT 0.4 06/28/2022 1254      Component Value Date/Time   TSH 2.750 06/28/2022 1254   Nutritional Lab Results  Component Value Date   VD25OH 34.3 06/28/2022   VD25OH 39.9 07/06/2021   VD25OH 26.4 (L) 01/21/2021    ASSOCIATED CONDITIONS ADDRESSED TODAY  ASSESSMENT AND PLAN  Problem List Items Addressed  This Visit     Prediabetes - Primary    Prediabetes Last A1c was 6.0/ Insulin 38.5- not at goals.   Medication(s): Metformin 500 mg once daily breakfast No GI side effects with metformin.   He is working on [employer], technical sales to decrease simple carbohydrates, increase lean proteins and exercise to promote weight loss, improve glycemic control and prevent progression to Type 2 diabetes.   Lab Results  Component Value Date   HGBA1C 6.0 (H) 06/28/2022   HGBA1C 6.1 (H) 07/06/2021   HGBA1C 6.2 (H) 01/21/2021   Lab Results  Component Value Date   INSULIN 38.5 (H) 06/28/2022   INSULIN 25.8 (H) 07/06/2021   INSULIN 30.7 (H) 01/21/2021   Plan: Continue and increase dose Metformin 500 mg twice daily with meals or metformin 1000 mg once daily if has difficulty with twice daily dosing.  Will plan to recheck A1c, insulin levels over the next 1-2 months.  Continue working on nutrition plan to decrease simple carbohydrates, increase lean proteins and exercise to promote weight loss, improve glycemic control and prevent progression to Type 2 diabetes.         Relevant Medications   metFORMIN (GLUCOPHAGE) 500 MG tablet   Vitamin D deficiency    Vitamin D Deficiency Vitamin D is not at goal of 50.  Most recent vitamin D level was 34.3. He is on  prescription ergocalciferol 50,000 IU weekly. No N/V or muscle weakness or other side effects with vitamin D.  Set alarm to remember to take consistently.  Lab Results  Component Value Date   VD25OH 34.3 06/28/2022   VD25OH 39.9 07/06/2021   VD25OH 26.4 (L) 01/21/2021   Plan: Continue and refill  prescription ergocalciferol 50,000 IU weekly Low vitamin D levels can be associated with adiposity and may result in leptin resistance and weight gain. Also associated with fatigue. Currently on vitamin D supplementation without any adverse effects.  Recheck vit d level in 1-2 months.        Relevant Medications   Vitamin D, Ergocalciferol, (DRISDOL) 1.25  MG (50000 UNIT) CAPS capsule   Low serum vitamin B12    On B 12 500 mcg daily and B 12 rich diet.  No side effects.    Plan; Continue B 12 500 mcg daily. Recheck B 12 with next labs in 1-2 months.       Relevant Medications   Cyanocobalamin (VITAMIN B 12) 500 MCG TABS   Obesity (HCC)- start BMI-46.04   Relevant Medications   metFORMIN (GLUCOPHAGE) 500 MG tablet   BMI 40.0-44.9, adult (HCC) Current BMI 42.9   Relevant Medications   metFORMIN (GLUCOPHAGE) 500 MG tablet  Current BMI  43.4  TREATMENT PLAN FOR OBESITY:  Recommended Dietary Goals  Trayce is currently in the action stage of change. As such, his goal is to continue weight management plan. He has agreed to the Category 4 Plan.  Behavioral Intervention  We discussed the following Behavioral Modification Strategies today: increasing lean protein intake, decreasing simple carbohydrates , increasing vegetables, avoiding skipping meals, increasing water intake, work on meal planning and preparation, continue to practice mindfulness when eating, and planning for success.  Additional resources provided today: NA  Recommended Physical Activity Goals  Cassiel has been advised to work up to 150 minutes of moderate intensity aerobic activity a week and strengthening exercises 2-3 times per week for cardiovascular health, weight loss maintenance and preservation of muscle mass.   He has agreed to Continue current level of physical activity    Pharmacotherapy We discussed various medication options to help Garv with his weight loss efforts and we both agreed to continue and increase metformin to either 500 mg twice daily or 1000 mg daily if unable to consistently take twice daily and continue to work on nutritional and behavioral strategies to promote weight loss.  .    Return in about 4 weeks (around 09/23/2022).Marland Kitchen He was informed of the importance of frequent follow up visits to maximize his success with intensive lifestyle  modifications for his multiple health conditions.   ATTESTASTION STATEMENTS:  Reviewed by clinician on day of visit: allergies, medications, problem list, medical history, surgical history, family history, social history, and previous encounter notes.   I have personally spent 34 minutes total time today in preparation, patient care, nutritional counseling and documentation for this visit, including the following: review of clinical lab tests; review of medical tests/procedures/services.      Katalyna Socarras, PA-C

## 2022-08-26 NOTE — Assessment & Plan Note (Signed)
On B 12 500 mcg daily and B 12 rich diet.  No side effects.    Plan; Continue B 12 500 mcg daily. Recheck B 12 with next labs in 1-2 months.

## 2022-09-29 ENCOUNTER — Ambulatory Visit (INDEPENDENT_AMBULATORY_CARE_PROVIDER_SITE_OTHER): Payer: BC Managed Care – PPO | Admitting: Physician Assistant

## 2022-09-29 ENCOUNTER — Encounter (INDEPENDENT_AMBULATORY_CARE_PROVIDER_SITE_OTHER): Payer: Self-pay | Admitting: Physician Assistant

## 2022-09-29 VITALS — BP 133/69 | HR 77 | Temp 97.8°F | Ht 74.0 in | Wt 336.0 lb

## 2022-09-29 DIAGNOSIS — E559 Vitamin D deficiency, unspecified: Secondary | ICD-10-CM

## 2022-09-29 DIAGNOSIS — R7303 Prediabetes: Secondary | ICD-10-CM

## 2022-09-29 DIAGNOSIS — E538 Deficiency of other specified B group vitamins: Secondary | ICD-10-CM

## 2022-09-29 DIAGNOSIS — E669 Obesity, unspecified: Secondary | ICD-10-CM

## 2022-09-29 DIAGNOSIS — I10 Essential (primary) hypertension: Secondary | ICD-10-CM

## 2022-09-29 DIAGNOSIS — Z6841 Body Mass Index (BMI) 40.0 and over, adult: Secondary | ICD-10-CM

## 2022-09-29 MED ORDER — VITAMIN D (ERGOCALCIFEROL) 1.25 MG (50000 UNIT) PO CAPS: 50000.0000 [IU] | ORAL_CAPSULE | ORAL | 0 refills | Status: DC

## 2022-09-29 NOTE — Progress Notes (Addendum)
.smr  Office: [phone]  /  Fax: [phone]  WEIGHT SUMMARY AND BIOMETRICS  Vitals Temp: 97.8 F (36.6 C) BP: 133/69 Pulse Rate: 77 SpO2: 95 %   Anthropometric Measurements Height: 6\' 2"  (1.88 m) Weight: (!) 336 lb (152.4 kg) BMI (Calculated): 43.12 Weight at Last Visit: 338 lb Weight Lost Since Last Visit: 2 lb Weight Gained Since Last Visit: 0 Starting Weight: 349 lb   Body Composition  Body Fat %: 37.6 % Fat Mass (lbs): 126.6 lbs Muscle Mass (lbs): 200 lbs Total Body Water (lbs): 142.8 lbs Visceral Fat Rating : 24   Other Clinical Data Fasting: yes Labs: No Today's Visit #: 22 Starting Date: 01/21/21     HPI  Chief Complaint: OBESITY  Kevin [name] is here to discuss his progress with his obesity treatment plan. He is on the the Category 4 Plan and states he is following his eating plan approximately 50 % of the time. He states he is exercising doing yardwork.   Interval History:  Since last office visit he is down 2 lbs. Struggled during the past month with father's illness and subsequent passing and not able to focus on plan or self care.  Working to get back on track with his health and self care.  His father passed away 10-01-2022 due to complications from cancer.   He, his brother and mother are going to [place] in July. Mom is from [place]. All are coping well.   Hunger/appetite-not excessive. Had to have some dental work/tooth pulled due to infection and has been on antibiotics. Supplementing with protein shakes as was not able to chew well following tooth extraction.  Cravings- not excessive Stress- Coping well following father's passing. Has been working from home during some of this and will have to return to the office soon.  Sleep- Using CPAP- Sleep is getting back to normal now, overall better Exercise-Discussed starting with walking 10-15 minutes 4-5 days weekly and building up from there. He is doing heavy yard work weekly right now.   Hydration-Does well with hydration using Cirkul bottle.   Sees his PCP July 17th and will have follow up labs done at that time.   Pharmacotherapy: metformin 500 mg daily ( He has been on antibiotics for his dental infection and had to decrease to 500 mg daily due to stomach upset and this helped decrease his nausea. He will complete antibiotics later this week and will resume 1000 mg daily once he is feeling well) No other side effects with metformin.Marland Kitchen   TREATMENT PLAN FOR OBESITY:  Recommended Dietary Goals  Kevin [name] is currently in the action stage of change. As such, his goal is to continue weight management plan. He has agreed to the Category 4 Plan.  Behavioral Intervention  We discussed the following Behavioral Modification Strategies today: increasing lean protein intake, decreasing simple carbohydrates , increasing vegetables, increasing lower glycemic fruits, increasing water intake, continue to practice mindfulness when eating, and planning for success.  Additional resources provided today: Category 4 plan with grocery list      Smart fruits list  Recommended Physical Activity Goals  Kevin [name] has been advised to work up to 150 minutes of moderate intensity aerobic activity a week and strengthening exercises 2-3 times per week for cardiovascular health, weight loss maintenance and preservation of muscle mass.   He has agreed to Think about ways to increase daily physical activity and overcoming barriers to exercise and Increase physical activity in their day and reduce sedentary time (increase NEAT).  Pharmacotherapy We discussed various medication options to help Kevin [name] with his weight loss efforts and we both agreed to continue metformin for prediabetes.    Return in about 5 weeks (around 11/03/2022).Marland Kitchen He was informed of the importance of frequent follow up visits to maximize his success with intensive lifestyle modifications for his multiple health conditions.  PHYSICAL  EXAM:  Blood pressure 133/69, pulse 77, temperature 97.8 F (36.6 C), height 6\' 2"  (1.88 m), weight (!) 336 lb (152.4 kg), SpO2 95 %. Body mass index is 43.14 kg/m.  General: He is overweight, cooperative, alert, well developed, and in no acute distress. PSYCH: Has normal mood, affect and thought process.   Cardiovascular: HR 70's regular Lungs: Normal breathing effort, no conversational dyspnea. Neuro: no focal deficits  DIAGNOSTIC DATA REVIEWED:  BMET    Component Value Date/Time   NA 140 06/28/2022 1254   K 4.2 06/28/2022 1254   CL 101 06/28/2022 1254   CO2 23 06/28/2022 1254   GLUCOSE 114 (H) 06/28/2022 1254   BUN 17 06/28/2022 1254   CREATININE 1.06 06/28/2022 1254   CALCIUM 9.5 06/28/2022 1254   Lab Results  Component Value Date   HGBA1C 6.0 (H) 06/28/2022   HGBA1C 6.2 (H) 01/21/2021   Lab Results  Component Value Date   INSULIN 38.5 (H) 06/28/2022   INSULIN 30.7 (H) 01/21/2021   Lab Results  Component Value Date   TSH 2.750 06/28/2022   CBC    Component Value Date/Time   WBC 6.7 06/28/2022 1254   RBC 4.96 06/28/2022 1254   HGB 15.3 06/28/2022 1254   HCT 44.9 06/28/2022 1254   PLT 280 06/28/2022 1254   MCV 91 06/28/2022 1254   MCH 30.8 06/28/2022 1254   MCHC 34.1 06/28/2022 1254   RDW 13.1 06/28/2022 1254   Iron Studies No results found for: "IRON", "TIBC", "FERRITIN", "IRONPCTSAT" Lipid Panel     Component Value Date/Time   CHOL 165 06/28/2022 1254   TRIG 177 (H) 06/28/2022 1254   HDL 52 06/28/2022 1254   LDLCALC 83 06/28/2022 1254   Hepatic Function Panel     Component Value Date/Time   PROT 7.1 06/28/2022 1254   ALBUMIN 4.5 06/28/2022 1254   AST 22 06/28/2022 1254   ALT 45 (H) 06/28/2022 1254   ALKPHOS 97 06/28/2022 1254   BILITOT 0.4 06/28/2022 1254      Component Value Date/Time   TSH 2.750 06/28/2022 1254   Nutritional Lab Results  Component Value Date   VD25OH 34.3 06/28/2022   VD25OH 39.9 07/06/2021   VD25OH 26.4 (L)  01/21/2021    ASSOCIATED CONDITIONS ADDRESSED TODAY  ASSESSMENT AND PLAN  Problem List Items Addressed This Visit     Prediabetes - Primary   Vitamin D deficiency   Relevant Medications   Vitamin D, Ergocalciferol, (DRISDOL) 1.25 MG (50000 UNIT) CAPS capsule   Obesity (HCC)- start BMI-46.04   BMI 40.0-44.9, adult (HCC) Current BMI 42.9   Essential hypertension   Prediabetes with insulin resistance.  Last A1c was 6.0- not at goal/Insulin 38.5- not at goal.   Medication(s): Metformin 500 mg once daily breakfast Having some increased nausea while on antibiotics for dental infection. Otherwise no side effects with metformin.  Polyphagia:No Lab Results  Component Value Date   HGBA1C 6.0 (H) 06/28/2022   HGBA1C 6.1 (H) 07/06/2021   HGBA1C 6.2 (H) 01/21/2021   Lab Results  Component Value Date   INSULIN 38.5 (H) 06/28/2022   INSULIN 25.8 (H) 07/06/2021   INSULIN  30.7 (H) 01/21/2021    Plan: Continue Metformin 500 mg once daily breakfast and increase back to 1000 mg daily once tolerating from a GI standpoint.  Continue working on nutrition plan to decrease simple carbohydrates, increase lean proteins and exercise to promote weight loss, improve glycemic control and prevent progression to Type 2 diabetes.   Hypertension Hypertension well controlled, asymptomatic, and no significant medication side effects noted.  Medication(s): Zestoretic 10-12.5 mg daily Renal function is normal. Working on nutrition plan and exercise to promote weight loss and improve BP control.   BP Readings from Last 3 Encounters:  09/29/22 133/69  08/26/22 105/67  07/26/22 112/76   Lab Results  Component Value Date   CREATININE 1.06 06/28/2022   CREATININE 1.01 07/06/2021   CREATININE 1.01 01/21/2021   No results found for: "GFR"  Plan: Continue all antihypertensives at current dosages. Continue to work on nutrition plan to promote weight loss and improve BP control.    Vitamin D  Deficiency Vitamin D is not at goal of 50.  Most recent vitamin D level was 34.3. He is on  prescription ergocalciferol 50,000 IU weekly. Lab Results  Component Value Date   VD25OH 34.3 06/28/2022   VD25OH 39.9 07/06/2021   VD25OH 26.4 (L) 01/21/2021    Plan: Continue and refill  prescription ergocalciferol 50,000 IU weekly Low vitamin D levels can be associated with adiposity and may result in leptin resistance and weight gain. Also associated with fatigue. Currently on vitamin D supplementation without any adverse effects.     ATTESTASTION STATEMENTS:  Reviewed by clinician on day of visit: allergies, medications, problem list, medical history, surgical history, family history, social history, and previous encounter notes.   I have personally spent 40 minutes total time today in preparation, patient care, nutritional counseling and documentation for this visit, including the following: review of clinical lab tests; review of medical tests/procedures/services.      Franke Menter, PA-C

## 2022-10-23 ENCOUNTER — Other Ambulatory Visit (INDEPENDENT_AMBULATORY_CARE_PROVIDER_SITE_OTHER): Payer: Self-pay | Admitting: Physician Assistant

## 2022-10-23 DIAGNOSIS — E559 Vitamin D deficiency, unspecified: Secondary | ICD-10-CM

## 2022-11-03 ENCOUNTER — Encounter (INDEPENDENT_AMBULATORY_CARE_PROVIDER_SITE_OTHER): Payer: Self-pay | Admitting: Physician Assistant

## 2022-11-03 ENCOUNTER — Ambulatory Visit (INDEPENDENT_AMBULATORY_CARE_PROVIDER_SITE_OTHER): Payer: BC Managed Care – PPO | Admitting: Physician Assistant

## 2022-11-03 VITALS — BP 117/73 | HR 86 | Temp 98.0°F | Ht 74.0 in | Wt 339.0 lb

## 2022-11-03 DIAGNOSIS — I10 Essential (primary) hypertension: Secondary | ICD-10-CM

## 2022-11-03 DIAGNOSIS — E669 Obesity, unspecified: Secondary | ICD-10-CM

## 2022-11-03 DIAGNOSIS — R7303 Prediabetes: Secondary | ICD-10-CM

## 2022-11-03 DIAGNOSIS — E559 Vitamin D deficiency, unspecified: Secondary | ICD-10-CM

## 2022-11-03 DIAGNOSIS — E538 Deficiency of other specified B group vitamins: Secondary | ICD-10-CM | POA: Diagnosis not present

## 2022-11-03 DIAGNOSIS — Z6841 Body Mass Index (BMI) 40.0 and over, adult: Secondary | ICD-10-CM

## 2022-11-03 MED ORDER — VITAMIN D (ERGOCALCIFEROL) 1.25 MG (50000 UNIT) PO CAPS
50000.0000 [IU] | ORAL_CAPSULE | ORAL | 0 refills | Status: DC
Start: 2022-11-03 — End: 2022-12-21

## 2022-11-03 NOTE — Progress Notes (Signed)
.smr  Office: [phone]  /  Fax: [phone]  WEIGHT SUMMARY AND BIOMETRICS  Vitals:   11/03/22 0700  BP: 117/73  Pulse: 86  Temp: 98 F (36.7 C)  Height: 6\' 2"  (1.88 m)  Weight: (!) 339 lb (153.8 kg)  SpO2: 96%  BMI (Calculated): 43.51        HPI  Chief Complaint: OBESITY  Kevin [name] is here to discuss his progress with his obesity treatment plan. He is on the the Category 4 Plan and states he is following his eating plan approximately 60 % of the time. He states he is exercising walking 30 minutes 2 times per week.   Interval History:  Since last office visit he up 3 lbs Hunger/appetite-moderate control. Reports was more mindful of protein needs. Wife has been doing more meal planning and prepping. Has still needed to do a bit of grab and go meals. We discussed considering a meal plan like Fresh and Lean or Clean [place] to minimize need for planning and prepping to help him stay on track more with nutrition plan.  Cravings- not excessive Stress- Still dealing with father's estate and adjusting to things. Doing better overall  Exercise-walking 2-3 times weekly Hydration-adequate.  Discussed increasing fiber intake to at least 3 servings daily.   Has trip to [place] with family coming up later this month.  Sees PCP in few weeks and will have fasting labs done at that time for AWV  Pharmacotherapy: metformin 500 mg daily. Unable to tolerate increased dose due to nausea and doing well now, no GI upset with once daily dosing.   TREATMENT PLAN FOR OBESITY:  Recommended Dietary Goals  Carry is currently in the action stage of change. As such, his goal is to continue weight management plan. He has agreed to the Category 4 Plan.  Behavioral Intervention  We discussed the following Behavioral Modification Strategies today: increasing lean protein intake, decreasing simple carbohydrates , increasing vegetables, increasing lower glycemic fruits, increasing fiber rich foods,  avoiding skipping meals, increasing water intake, work on meal planning and preparation, keeping healthy foods at home, decreasing eating out or consumption of processed foods, and making healthy choices when eating convenient foods, continue to practice mindfulness when eating, and planning for success.  Additional resources provided today: NA  Recommended Physical Activity Goals  Fannie has been advised to work up to 150 minutes of moderate intensity aerobic activity a week and strengthening exercises 2-3 times per week for cardiovascular health, weight loss maintenance and preservation of muscle mass.   He has agreed to Continue current level of physical activity  and Increase physical activity in their day and reduce sedentary time (increase NEAT).   Pharmacotherapy We discussed various medication options to help Layn with his weight loss efforts and we both agreed to continue metformin for prediabetes.    Return in about 4 weeks (around 12/01/2022).Marland Kitchen He was informed of the importance of frequent follow up visits to maximize his success with intensive lifestyle modifications for his multiple health conditions.  PHYSICAL EXAM:  Blood pressure 117/73, pulse 86, temperature 98 F (36.7 C), height 6\' 2"  (1.88 m), weight (!) 339 lb (153.8 kg), SpO2 96 %. Body mass index is 43.53 kg/m.  General: He is overweight, cooperative, alert, well developed, and in no acute distress. PSYCH: Has normal mood, affect and thought process.   Cardiovascular: HR 80's regular, BP 117/73 Lungs: Normal breathing effort, no conversational dyspnea.  DIAGNOSTIC DATA REVIEWED:  BMET    Component Value Date/Time  NA 140 06/28/2022 1254   K 4.2 06/28/2022 1254   CL 101 06/28/2022 1254   CO2 23 06/28/2022 1254   GLUCOSE 114 (H) 06/28/2022 1254   BUN 17 06/28/2022 1254   CREATININE 1.06 06/28/2022 1254   CALCIUM 9.5 06/28/2022 1254   Lab Results  Component Value Date   HGBA1C 6.0 (H) 06/28/2022    HGBA1C 6.2 (H) 01/21/2021   Lab Results  Component Value Date   INSULIN 38.5 (H) 06/28/2022   INSULIN 30.7 (H) 01/21/2021   Lab Results  Component Value Date   TSH 2.750 06/28/2022   CBC    Component Value Date/Time   WBC 6.7 06/28/2022 1254   RBC 4.96 06/28/2022 1254   HGB 15.3 06/28/2022 1254   HCT 44.9 06/28/2022 1254   PLT 280 06/28/2022 1254   MCV 91 06/28/2022 1254   MCH 30.8 06/28/2022 1254   MCHC 34.1 06/28/2022 1254   RDW 13.1 06/28/2022 1254   Iron Studies No results found for: "IRON", "TIBC", "FERRITIN", "IRONPCTSAT" Lipid Panel     Component Value Date/Time   CHOL 165 06/28/2022 1254   TRIG 177 (H) 06/28/2022 1254   HDL 52 06/28/2022 1254   LDLCALC 83 06/28/2022 1254   Hepatic Function Panel     Component Value Date/Time   PROT 7.1 06/28/2022 1254   ALBUMIN 4.5 06/28/2022 1254   AST 22 06/28/2022 1254   ALT 45 (H) 06/28/2022 1254   ALKPHOS 97 06/28/2022 1254   BILITOT 0.4 06/28/2022 1254      Component Value Date/Time   TSH 2.750 06/28/2022 1254   Nutritional Lab Results  Component Value Date   VD25OH 34.3 06/28/2022   VD25OH 39.9 07/06/2021   VD25OH 26.4 (L) 01/21/2021    ASSOCIATED CONDITIONS ADDRESSED TODAY  ASSESSMENT AND PLAN  Problem List Items Addressed This Visit     Prediabetes   Vitamin D deficiency   Relevant Medications   Vitamin D, Ergocalciferol, (DRISDOL) 1.25 MG (50000 UNIT) CAPS capsule   Low serum vitamin B12   Obesity (HCC)- start BMI-46.04   BMI 40.0-44.9, adult (HCC) Current BMI 42.9   Essential hypertension - Primary    Hypertension Hypertension asymptomatic, reasonably well controlled, and no significant medication side effects noted.  Medication(s): Zestoretic 10-12.5 mg daily Renal function normal.   BP Readings from Last 3 Encounters:  11/03/22 117/73  09/29/22 133/69  08/26/22 105/67   Lab Results  Component Value Date   CREATININE 1.06 06/28/2022   CREATININE 1.01 07/06/2021   CREATININE 1.01  01/21/2021   No results found for: "GFR"  Plan: Continue all antihypertensives at current dosages. Continue to work on nutrition plan to promote weight loss and improve BP control.    Prediabetes Last A1c was 6.0 Insulin 38.5- not at goals,  Medication(s): Metformin 500 mg once daily breakfast Polyphagia:No Lab Results  Component Value Date   HGBA1C 6.0 (H) 06/28/2022   HGBA1C 6.1 (H) 07/06/2021   HGBA1C 6.2 (H) 01/21/2021   Lab Results  Component Value Date   INSULIN 38.5 (H) 06/28/2022   INSULIN 25.8 (H) 07/06/2021   INSULIN 30.7 (H) 01/21/2021    Plan: Continue Metformin 500 mg once daily breakfast No refill needed this visit.  Continue working on nutrition plan to decrease simple carbohydrates, increase lean proteins and exercise to promote weight loss, improve glycemic control and prevent progression to Type 2 diabetes.    Vitamin D Deficiency Vitamin D is not at goal of 50.  Most recent vitamin D  level was 34.3. He is on  prescription ergocalciferol 50,000 IU weekly. Lab Results  Component Value Date   VD25OH 34.3 06/28/2022   VD25OH 39.9 07/06/2021   VD25OH 26.4 (L) 01/21/2021    Plan: Continue and refill  prescription ergocalciferol 50,000 IU weekly Low vitamin D levels can be associated with adiposity and may result in leptin resistance and weight gain. Also associated with fatigue. Currently on vitamin D supplementation without any adverse effects.  To see PCP over the next few weeks and will have labs checked at that time. Will recheck here if not checked by PCP.   Low B 12 level: Last B 12 at 370. Low normal range. Fatigue at times. On Metformin for prediabetes and discussed B 12 absorption may be lower due to metformin therapy .  Plan: Advised to take B 12 500 mcg daily and will plan to recheck B 12 level over the next 2-3 months.  ATTESTASTION STATEMENTS:  Reviewed by clinician on day of visit: allergies, medications, problem list, medical history,  surgical history, family history, social history, and previous encounter notes.   I have personally spent 40 minutes total time today in preparation, patient care, nutritional counseling and documentation for this visit, including the following: review of clinical lab tests; review of medical tests/procedures/services.      Shaquoya Cosper, PA-C

## 2022-11-17 DIAGNOSIS — E7800 Pure hypercholesterolemia, unspecified: Secondary | ICD-10-CM | POA: Diagnosis not present

## 2022-11-17 DIAGNOSIS — R7301 Impaired fasting glucose: Secondary | ICD-10-CM | POA: Diagnosis not present

## 2022-11-17 DIAGNOSIS — Z125 Encounter for screening for malignant neoplasm of prostate: Secondary | ICD-10-CM | POA: Diagnosis not present

## 2022-11-17 DIAGNOSIS — I10 Essential (primary) hypertension: Secondary | ICD-10-CM | POA: Diagnosis not present

## 2022-11-17 DIAGNOSIS — Z0000 Encounter for general adult medical examination without abnormal findings: Secondary | ICD-10-CM | POA: Diagnosis not present

## 2022-11-19 ENCOUNTER — Other Ambulatory Visit (INDEPENDENT_AMBULATORY_CARE_PROVIDER_SITE_OTHER): Payer: Self-pay | Admitting: Physician Assistant

## 2022-11-19 DIAGNOSIS — R7303 Prediabetes: Secondary | ICD-10-CM

## 2022-11-24 ENCOUNTER — Ambulatory Visit (INDEPENDENT_AMBULATORY_CARE_PROVIDER_SITE_OTHER): Payer: BC Managed Care – PPO | Admitting: Family Medicine

## 2022-11-27 ENCOUNTER — Other Ambulatory Visit (INDEPENDENT_AMBULATORY_CARE_PROVIDER_SITE_OTHER): Payer: Self-pay | Admitting: Physician Assistant

## 2022-11-27 DIAGNOSIS — E559 Vitamin D deficiency, unspecified: Secondary | ICD-10-CM

## 2022-12-20 ENCOUNTER — Ambulatory Visit (INDEPENDENT_AMBULATORY_CARE_PROVIDER_SITE_OTHER): Payer: BC Managed Care – PPO | Admitting: Physician Assistant

## 2022-12-20 ENCOUNTER — Encounter (INDEPENDENT_AMBULATORY_CARE_PROVIDER_SITE_OTHER): Payer: Self-pay

## 2022-12-20 DIAGNOSIS — I10 Essential (primary) hypertension: Secondary | ICD-10-CM

## 2022-12-20 DIAGNOSIS — R7303 Prediabetes: Secondary | ICD-10-CM

## 2022-12-20 DIAGNOSIS — E559 Vitamin D deficiency, unspecified: Secondary | ICD-10-CM

## 2022-12-20 DIAGNOSIS — E538 Deficiency of other specified B group vitamins: Secondary | ICD-10-CM

## 2022-12-20 NOTE — Progress Notes (Signed)
.smr  Office: [phone]  /  Fax: [phone]  WEIGHT SUMMARY AND BIOMETRICS  Vitals Temp: 97.6 F (36.4 C) BP: 123/73 Pulse Rate: 68 SpO2: 96 %   Anthropometric Measurements Height: 6\' 2"  (1.88 m) Weight: (!) 338 lb (153.3 kg) BMI (Calculated): 43.38 Weight at Last Visit: 339 lb Weight Lost Since Last Visit: 1 lb Weight Gained Since Last Visit: 0 Starting Weight: 349 lb Total Weight Loss (lbs): 11 lb (4.99 kg) Peak Weight: 350 lb   Body Composition  Body Fat %: 38.4 % Fat Mass (lbs): 129.8 lbs Muscle Mass (lbs): 198 lbs Total Body Water (lbs): 144 lbs Visceral Fat Rating : 24   Other Clinical Data Fasting: No Labs: No Today's Visit #: 24 Starting Date: 01/21/21     HPI  Chief Complaint: OBESITY  Courey is here to discuss his progress with his obesity treatment plan. He is on the the Category 4 Plan and states he is following his eating plan approximately 50 % of the time. He states he is exercising 0 minutes 0 times per week.   Interval History:  Since last office visit he down 1 lb.  Bio impedence scale was reviewed with the patient: Down 1.6 lbs muscle mass Up 0.2 lbs adipose mass Down 0.8 lbs total body water He is getting back on track with his nutrition plan following a wonderful trip home to [place].  He reports they visited his family's farm- Ate fruit fresh from the gardens, and were active Visited with His grandfather-94 years young.  Working on Research scientist (medical). Family has been sick with GI bug, but doing better now.   Hunger/appetite-increased since returning home. Missing the freshness of the foods in [place]. We discussed trying to avoid fast food as much as possible over the next few weeks.  Cravings- Feels fast food will be challenging. Discussed goal of no fast food/meal prepping for lunches/breakfast for the next few weeks.  Stress- Getting back into work routines after returning last week.  Sleep-  Adjusting back to routines Exercise-Was active in [place]. Having some pain over coccyx- ? [place]. Discussed he may need to see his PCP if pain does not resolve quickly. Denies any fever or redness of the area. No neuro deficits. No bowel or bladder symptoms.   [facility]CP [facility]  and Labs were reviewed today.    Pharmacotherapy: metformin 500 mg once daily. No GI side effects. He is going to try and increase to twice daily for primary indication of prediabetes.   TREATMENT PLAN FOR OBESITY:  Recommended Dietary Goals  Caitlin is currently in the action stage of change. As such, his goal is to continue weight management plan. He has agreed to the Category 4 Plan.  Behavioral Intervention  We discussed the following Behavioral Modification Strategies today: increasing lean protein intake, decreasing simple carbohydrates , increasing vegetables, increasing lower glycemic fruits, increasing fiber rich foods, increasing water intake, work on meal planning and preparation, keeping healthy foods at home, decreasing eating out or consumption of processed foods, and making healthy choices when eating convenient foods, emotional eating strategies and understanding the difference between hunger signals and cravings, continue to practice mindfulness when eating, and planning for success.  Additional resources provided today: NA  Recommended Physical Activity Goals  Zaryn has been advised to work up to 150 minutes of moderate intensity aerobic activity a week and strengthening exercises 2-3  times per week for cardiovascular health, weight loss maintenance and preservation of muscle mass.   He has agreed to Increase physical activity in their day and reduce sedentary time (increase NEAT).   Pharmacotherapy We discussed various medication options to help Maxie with his weight loss efforts and we both agreed to increase metformin to 500 mg  twice daily. .    Return in about 4 weeks (around 01/17/2049).Marland Kitchen He was informed of the importance of frequent follow up visits to maximize his success with intensive lifestyle modifications for his multiple health conditions.  PHYSICAL EXAM:  Blood pressure 123/73, pulse 68, temperature 97.6 F (36.4 C), height 6\' 2"  (1.88 m), weight (!) 338 lb (153.3 kg), SpO2 96%. Body mass index is 43.4 kg/m.  General: He is overweight, cooperative, alert, well developed, and in no acute distress. PSYCH: Has normal mood, affect and thought process.   Cardiovascular: HR 60's regular, BP 123/73 Lungs: Normal breathing effort, no conversational dyspnea. Neuro: no focal deficits  DIAGNOSTIC DATA REVIEWED:  BMET    Component Value Date/Time   NA 140 06/28/2022 1254   K 4.2 06/28/2022 1254   CL 101 06/28/2022 1254   CO2 23 06/28/2022 1254   GLUCOSE 114 (H) 06/28/2022 1254   BUN 17 06/28/2022 1254   CREATININE 1.06 06/28/2022 1254   CALCIUM 9.5 06/28/2022 1254   Lab Results  Component Value Date   HGBA1C 6.0 (H) 06/28/2022   HGBA1C 6.2 (H) 01/21/2021   Lab Results  Component Value Date   INSULIN 38.5 (H) 06/28/2022   INSULIN 30.7 (H) 01/21/2021   Lab Results  Component Value Date   TSH 2.750 06/28/2022   CBC    Component Value Date/Time   WBC 6.7 06/28/2022 1254   RBC 4.96 06/28/2022 1254   HGB 15.3 06/28/2022 1254   HCT 44.9 06/28/2022 1254   PLT 280 06/28/2022 1254   MCV 91 06/28/2022 1254   MCH 30.8 06/28/2022 1254   MCHC 34.1 06/28/2022 1254   RDW 13.1 06/28/2022 1254   Iron Studies No results found for: "IRON", "TIBC", "FERRITIN", "IRONPCTSAT" Lipid Panel     Component Value Date/Time   CHOL 165 06/28/2022 1254   TRIG 177 (H) 06/28/2022 1254   HDL 52 06/28/2022 1254   LDLCALC 83 06/28/2022 1254   Hepatic Function Panel     Component Value Date/Time   PROT 7.1 06/28/2022 1254   ALBUMIN 4.5 06/28/2022 1254   AST 22 06/28/2022 1254   ALT 45 (H) 06/28/2022 1254    ALKPHOS 97 06/28/2022 1254   BILITOT 0.4 06/28/2022 1254      Component Value Date/Time   TSH 2.750 06/28/2022 1254   Nutritional Lab Results  Component Value Date   VD25OH 34.3 06/28/2022   VD25OH 39.9 07/06/2021   VD25OH 26.4 (L) 01/21/2021    ASSOCIATED CONDITIONS ADDRESSED TODAY  ASSESSMENT AND PLAN  Problem List Items Addressed This Visit     Prediabetes - Primary   Relevant Medications   metFORMIN (GLUCOPHAGE) 500 MG tablet   Vitamin D deficiency   Relevant Medications   Vitamin D, Ergocalciferol, (DRISDOL) 1.25 MG (50000 UNIT) CAPS capsule   Obesity (HCC)- start BMI-46.04   Relevant Medications   metFORMIN (GLUCOPHAGE) 500 MG tablet   BMI 40.0-44.9, adult (HCC) Current BMI 42.9   Relevant Medications   metFORMIN (GLUCOPHAGE) 500 MG tablet   Essential hypertension   Other hyperlipidemia  Prediabetes Labs were reviewed today and discussed with the patient.  Last A1c was 6.0  11/17/22 at [place]. Improved but not at goal of < 5.6. Last insulin level elevated and not at goal.   Medication(s): Metformin 500 mg once daily breakfast Polyphagia:No Lab Results  Component Value Date   HGBA1C 6.0 (H) 06/28/2022   HGBA1C 6.1 (H) 07/06/2021   HGBA1C 6.2 (H) 01/21/2021   Lab Results  Component Value Date   INSULIN 38.5 (H) 06/28/2022   INSULIN 25.8 (H) 07/06/2021   INSULIN 30.7 (H) 01/21/2021    Plan: Continue and increase dose Metformin 500 mg twice daily with meals  Hypertension Labs were reviewed today and discussed with the patient.  Hypertension well controlled, asymptomatic, and no significant medication side effects noted.  Medication(s): Zestoretic 10/12.5 mg daily Renal function normal on labs at [facility] 11/17/22.   BP Readings from Last 3 Encounters:  12/21/22 123/73  11/03/22 117/73  09/29/22 133/69   Lab Results  Component Value Date   CREATININE 1.06 06/28/2022   CREATININE 1.01 07/06/2021   CREATININE 1.01 01/21/2021   No results found for:  "GFR"  Plan: Continue all antihypertensives at current dosages. Continue to work on nutrition plan to promote weight loss and improve BP control.    Vitamin D Deficiency Vitamin D is not at goal of 50.  Most recent vitamin D level was 34.3- not at goal of 50-70. He is on  prescription ergocalciferol 50,000 IU weekly. Reports no N/V or muscle weakness with Ergocalciferol.  Lab Results  Component Value Date   VD25OH 34.3 06/28/2022   VD25OH 39.9 07/06/2021   VD25OH 26.4 (L) 01/21/2021    Plan: Continue and refill  prescription ergocalciferol 50,000 IU weekly Low vitamin D levels can be associated with adiposity and may result in leptin resistance and weight gain. Also associated with fatigue. Currently on vitamin D supplementation without any adverse effects.  Will plan to recheck vitamin D level over the next 1-2 months to optimize supplementation/avoid over supplementation.   Hyperlipidemia Labs were reviewed today and discussed with the patient. Obtained at [facility] 11/17/22.  LDL is at goal. And stable/ HDL 55- slightly improved. Trig 152- improved, but not at goal of < 150.  Medication(s): atorvastatin 20 mg daily. Reports no side effects.   Cardiovascular risk factors: dyslipidemia, hypertension, male gender, obesity (BMI >= 30 kg/m2), and sedentary lifestyle  Lab Results  Component Value Date   CHOL 165 06/28/2022   HDL 52 06/28/2022   LDLCALC 83 06/28/2022   TRIG 177 (H) 06/28/2022   Lab Results  Component Value Date   ALT 45 (H) 06/28/2022   AST 22 06/28/2022   ALKPHOS 97 06/28/2022   BILITOT 0.4 06/28/2022   The 10-year ASCVD risk score (Arnett DK, et al., 2019) is: 2.8%   Values used to calculate the score:     Age: 24 years     Sex: Male     Is Non-Hispanic African American: No     Diabetic: No     Tobacco smoker: No     Systolic Blood Pressure: 123 mmHg     Is BP treated: Yes     HDL Cholesterol: 52 mg/dL     Total Cholesterol: 165  mg/dL  Plan: Continue statin. Continue to work on nutrition plan -decreasing simple carbohydrates, increasing lean proteins, decreasing saturated fats and cholesterol , avoiding trans fats and exercise as able to promote weight loss, improve lipids and decrease cardiovascular risks.    ATTESTASTION STATEMENTS:  Reviewed by clinician on day of visit: allergies, medications, problem list, medical  history, surgical history, family history, social history, and previous encounter notes.   I have personally spent 45 minutes total time today in preparation, patient care, nutritional counseling and documentation for this visit, including the following: review of clinical lab tests; review of medical tests/procedures/services.      Syre Knerr, PA-C

## 2022-12-21 ENCOUNTER — Ambulatory Visit (INDEPENDENT_AMBULATORY_CARE_PROVIDER_SITE_OTHER): Payer: BC Managed Care – PPO | Admitting: Physician Assistant

## 2022-12-21 ENCOUNTER — Encounter (INDEPENDENT_AMBULATORY_CARE_PROVIDER_SITE_OTHER): Payer: Self-pay | Admitting: Physician Assistant

## 2022-12-21 VITALS — BP 123/73 | HR 68 | Temp 97.6°F | Ht 74.0 in | Wt 338.0 lb

## 2022-12-21 DIAGNOSIS — R7303 Prediabetes: Secondary | ICD-10-CM

## 2022-12-21 DIAGNOSIS — E559 Vitamin D deficiency, unspecified: Secondary | ICD-10-CM

## 2022-12-21 DIAGNOSIS — E7849 Other hyperlipidemia: Secondary | ICD-10-CM | POA: Diagnosis not present

## 2022-12-21 DIAGNOSIS — I10 Essential (primary) hypertension: Secondary | ICD-10-CM

## 2022-12-21 DIAGNOSIS — E669 Obesity, unspecified: Secondary | ICD-10-CM

## 2022-12-21 DIAGNOSIS — E538 Deficiency of other specified B group vitamins: Secondary | ICD-10-CM

## 2022-12-21 DIAGNOSIS — Z6841 Body Mass Index (BMI) 40.0 and over, adult: Secondary | ICD-10-CM

## 2022-12-21 MED ORDER — METFORMIN HCL 500 MG PO TABS
500.0000 mg | ORAL_TABLET | Freq: Two times a day (BID) | ORAL | 0 refills | Status: DC
Start: 2022-12-21 — End: 2023-03-09

## 2022-12-21 MED ORDER — VITAMIN D (ERGOCALCIFEROL) 1.25 MG (50000 UNIT) PO CAPS: 50000.0000 [IU] | ORAL_CAPSULE | ORAL | 0 refills | Status: DC

## 2023-01-19 DIAGNOSIS — L84 Corns and callosities: Secondary | ICD-10-CM | POA: Diagnosis not present

## 2023-01-19 DIAGNOSIS — L600 Ingrowing nail: Secondary | ICD-10-CM | POA: Diagnosis not present

## 2023-01-26 ENCOUNTER — Ambulatory Visit (INDEPENDENT_AMBULATORY_CARE_PROVIDER_SITE_OTHER): Payer: BC Managed Care – PPO | Admitting: Physician Assistant

## 2023-01-26 ENCOUNTER — Encounter (INDEPENDENT_AMBULATORY_CARE_PROVIDER_SITE_OTHER): Payer: Self-pay | Admitting: Physician Assistant

## 2023-01-26 VITALS — BP 106/68 | HR 77 | Temp 98.1°F | Ht 72.5 in | Wt 338.0 lb

## 2023-01-26 DIAGNOSIS — M79671 Pain in right foot: Secondary | ICD-10-CM | POA: Insufficient documentation

## 2023-01-26 DIAGNOSIS — R7303 Prediabetes: Secondary | ICD-10-CM | POA: Diagnosis not present

## 2023-01-26 DIAGNOSIS — E7849 Other hyperlipidemia: Secondary | ICD-10-CM | POA: Diagnosis not present

## 2023-01-26 DIAGNOSIS — E559 Vitamin D deficiency, unspecified: Secondary | ICD-10-CM

## 2023-01-26 DIAGNOSIS — E669 Obesity, unspecified: Secondary | ICD-10-CM

## 2023-01-26 DIAGNOSIS — Z6841 Body Mass Index (BMI) 40.0 and over, adult: Secondary | ICD-10-CM

## 2023-01-26 MED ORDER — VITAMIN D (ERGOCALCIFEROL) 1.25 MG (50000 UNIT) PO CAPS
50000.0000 [IU] | ORAL_CAPSULE | ORAL | 0 refills | Status: DC
Start: 2023-01-26 — End: 2023-03-09

## 2023-01-26 NOTE — Progress Notes (Signed)
.smr  Office: [phone]  /  Fax: [phone]  WEIGHT SUMMARY AND BIOMETRICS  Vitals Temp: 98.1 F (36.7 C) BP: 106/68 Pulse Rate: 77 SpO2: 96 %   Anthropometric Measurements Height: 6' 0.5" (1.842 m) (rechecked height today) Weight: (!) 338 lb (153.3 kg) BMI (Calculated): 45.19 Weight at Last Visit: 338 lb Weight Lost Since Last Visit: 0 Weight Gained Since Last Visit: 0 Starting Weight: 349 lb Total Weight Loss (lbs): 11 lb (4.99 kg) Peak Weight: 350 lb   Body Composition  Body Fat %: 38.8 % Fat Mass (lbs): 131.2 lbs Muscle Mass (lbs): 196.8 lbs Total Body Water (lbs): 141 lbs Visceral Fat Rating : 25   Other Clinical Data Fasting: yes Labs: no Today's Visit #: 25 Starting Date: 01/21/21     HPI  Chief Complaint: OBESITY  Kevin [name] is here to discuss his progress with his obesity treatment plan. He is on the the Category 4 Plan and states he is following his eating plan approximately 50 % of the time. He states he is exercising 0 minutes 0 times per week.   Interval History:  Since last office visit he has maintained his weight.   The patient, with a history of obesity, prediabetes, hypertension, vitamin D deficiency, and hyperlipidemia, presents for a follow-up visit. He reports a recent foot issue that began after a pedicure while in [place]. The patient describes the foot pain as increasing over time, with swelling and redness noted. He has seen his regular doctor for this issue, who did not find signs of infection but referred him to a podiatrist for further evaluation. The foot issue has affected the patient's ability to exercise, which is a significant part of his obesity treatment plan.  The patient also discusses challenges with meal planning, which is being managed by his wife. He reports that his wife is now doing the grocery shopping and meal planning, which has helped to some extent. However, he acknowledges that maintaining a consistent medication  schedule, particularly for his metformin, remains a challenge. He is considering adjusting his medication schedule to once a day to improve adherence.  Pharmacotherapy: metformin 500 mg twice daily. No GI or other side effects, but having difficulty remembering to take second dose.   TREATMENT PLAN FOR OBESITY: Obesity Maintaining weight despite decreased physical activity due to foot pain. Discussed the importance of maintaining muscle mass during this period of decreased activity. -Consider upper body strengthening exercises. -Continue with meal planning and prepping. Recommended Dietary Goals  Kevin [name] is currently in the action stage of change. As such, his goal is to continue weight management plan. He has agreed to the Category 4 Plan.  Behavioral Intervention  We discussed the following Behavioral Modification Strategies today: increasing lean protein intake, decreasing simple carbohydrates , increasing vegetables, increasing lower glycemic fruits, avoiding skipping meals, increasing water intake, work on meal planning and preparation, decreasing eating out or consumption of processed foods, and making healthy choices when eating convenient foods, emotional eating strategies and understanding the difference between hunger signals and cravings, work on managing stress, creating time for self-care and relaxation measures, continue to practice mindfulness when eating, and planning for success.  Additional resources provided today: NA  Recommended Physical Activity Goals  Kevin [name] has been advised to work up to 150 minutes of moderate intensity aerobic activity a week and strengthening exercises 2-3 times per week for cardiovascular health, weight loss maintenance and preservation of muscle mass.   He has agreed to  work on some upper  body strengthening until he is walking better with less foot pain.    Pharmacotherapy We discussed various medication options to help Kevin [name] with his  weight loss efforts and we both agreed to change metformin to 1000 mg once daily for prediabetes .    Return in about 6 weeks (around 03/09/2023).Marland Kitchen He was informed of the importance of frequent follow up visits to maximize his success with intensive lifestyle modifications for his multiple health conditions.  PHYSICAL EXAM:  Blood pressure 106/68, pulse 77, temperature 98.1 F (36.7 C), height 6' 0.5" (1.842 m), weight (!) 338 lb (153.3 kg), SpO2 96%. Body mass index is 45.21 kg/m.  General: He is overweight, cooperative, alert, well developed, and in no acute distress. PSYCH: Has normal mood, affect and thought process.   Cardiovascular: HR 70's , BP 106/68 Lungs: Normal breathing effort, no conversational dyspnea.  DIAGNOSTIC DATA REVIEWED:  BMET    Component Value Date/Time   NA 140 06/28/2022 1254   K 4.2 06/28/2022 1254   CL 101 06/28/2022 1254   CO2 23 06/28/2022 1254   GLUCOSE 114 (H) 06/28/2022 1254   BUN 17 06/28/2022 1254   CREATININE 1.06 06/28/2022 1254   CALCIUM 9.5 06/28/2022 1254   Lab Results  Component Value Date   HGBA1C 6.0 (H) 06/28/2022   HGBA1C 6.2 (H) 01/21/2021   Lab Results  Component Value Date   INSULIN 38.5 (H) 06/28/2022   INSULIN 30.7 (H) 01/21/2021   Lab Results  Component Value Date   TSH 2.750 06/28/2022   CBC    Component Value Date/Time   WBC 6.7 06/28/2022 1254   RBC 4.96 06/28/2022 1254   HGB 15.3 06/28/2022 1254   HCT 44.9 06/28/2022 1254   PLT 280 06/28/2022 1254   MCV 91 06/28/2022 1254   MCH 30.8 06/28/2022 1254   MCHC 34.1 06/28/2022 1254   RDW 13.1 06/28/2022 1254   Iron Studies No results found for: "IRON", "TIBC", "FERRITIN", "IRONPCTSAT" Lipid Panel     Component Value Date/Time   CHOL 165 06/28/2022 1254   TRIG 177 (H) 06/28/2022 1254   HDL 52 06/28/2022 1254   LDLCALC 83 06/28/2022 1254   Hepatic Function Panel     Component Value Date/Time   PROT 7.1 06/28/2022 1254   ALBUMIN 4.5 06/28/2022 1254    AST 22 06/28/2022 1254   ALT 45 (H) 06/28/2022 1254   ALKPHOS 97 06/28/2022 1254   BILITOT 0.4 06/28/2022 1254      Component Value Date/Time   TSH 2.750 06/28/2022 1254   Nutritional Lab Results  Component Value Date   VD25OH 34.3 06/28/2022   VD25OH 39.9 07/06/2021   VD25OH 26.4 (L) 01/21/2021    ASSOCIATED CONDITIONS ADDRESSED TODAY  ASSESSMENT AND PLAN  Problem List Items Addressed This Visit     Prediabetes - Primary   Vitamin D deficiency   Relevant Medications   Vitamin D, Ergocalciferol, (DRISDOL) 1.25 MG (50000 UNIT) CAPS capsule   Obesity (HCC)- start BMI-46.04   BMI 45.0-49.9, adult (HCC)   Other hyperlipidemia   Foot pain, right     Prediabetes On Metformin, inconsistent with second daily dose. Recent A1C was 6.with PCP visit 11/17/22. -Consider taking Metformin 1000mg  once daily if tolerable, otherwise continue with current regimen and try to remember the second dose. Does not need refill this visit.  Continue working on nutrition plan to decrease simple carbohydrates, increase lean proteins and exercise to promote weight loss, improve glycemic control and prevent progression to Type 2  diabetes.   Vitamin D Deficiency Vitamin D is not at goal of 50.  Most recent vitamin D level was 34.3. He is on  prescription ergocalciferol 50,000 IU weekly. Lab Results  Component Value Date   VD25OH 34.3 06/28/2022   VD25OH 39.9 07/06/2021   VD25OH 26.4 (L) 01/21/2021    Plan: Continue and refill  prescription ergocalciferol 50,000 IU weekly Low vitamin D levels can be associated with adiposity and may result in leptin resistance and weight gain. Also associated with fatigue. Currently on vitamin D supplementation without any adverse effects.  Recheck vitamin D level next visit.   Hyperlipidemia Recent cholesterol panel at PCP visit showed HDL 55, LDL 98, and triglycerides 130. Discussed the importance of diet in managing triglycerides/LDL. LDL is at  goal. Medication(s): Lipitor 20 mg daily Cardiovascular risk factors: dyslipidemia, hypertension, male gender, obesity (BMI >= 30 kg/m2), and sedentary lifestyle  Lab Results  Component Value Date   CHOL 165 06/28/2022   HDL 52 06/28/2022   LDLCALC 83 06/28/2022   TRIG 177 (H) 06/28/2022   Lab Results  Component Value Date   ALT 45 (H) 06/28/2022   AST 22 06/28/2022   ALKPHOS 97 06/28/2022   BILITOT 0.4 06/28/2022   The 10-year ASCVD risk score (Arnett DK, et al., 2019) is: 2.1%   Values used to calculate the score:     Age: 53 years     Sex: Male     Is Non-Hispanic African American: No     Diabetic: No     Tobacco smoker: No     Systolic Blood Pressure: 106 mmHg     Is BP treated: Yes     HDL Cholesterol: 52 mg/dL     Total Cholesterol: 165 mg/dL  Plan: Continue statin. Continue to work on nutrition plan -decreasing simple carbohydrates, increasing lean proteins, decreasing saturated fats and cholesterol , avoiding trans fats and exercise as able to promote weight loss, improve lipids and decrease cardiovascular risks.  Foot Pain Pain and swelling in the right big toe following a pedicure. Clear drainage noted. No signs of infection. Referred to a podiatrist for further evaluation. -Continue with topical Bacitracin and Epsom salt soaks. -See podiatrist on Tuesday for further evaluation and management.   Follow-up in 6 weeks on November 6th.  ATTESTASTION STATEMENTS:  Reviewed by clinician on day of visit: allergies, medications, problem list, medical history, surgical history, family history, social history, and previous encounter notes.   I have personally spent 43 minutes total time today in preparation, patient care, nutritional counseling and documentation for this visit, including the following: review of clinical lab tests; review of medical tests/procedures/services.      Dollye Glasser, PA-C

## 2023-02-01 ENCOUNTER — Encounter: Payer: Self-pay | Admitting: Podiatry

## 2023-02-01 ENCOUNTER — Ambulatory Visit: Payer: BC Managed Care – PPO | Admitting: Podiatry

## 2023-02-01 DIAGNOSIS — L600 Ingrowing nail: Secondary | ICD-10-CM

## 2023-02-01 MED ORDER — CEPHALEXIN 500 MG PO CAPS: 500.0000 mg | ORAL_CAPSULE | Freq: Three times a day (TID) | ORAL | 0 refills | Status: AC

## 2023-02-01 MED ORDER — NEOMYCIN-POLYMYXIN-HC 3.5-10000-1 OT SUSP: OTIC | 0 refills | Status: AC

## 2023-02-01 NOTE — Progress Notes (Signed)
  Subjective:  Patient ID: Kevin [name], male    DOB: [birth year],  MRN: [id number]  Chief Complaint  Patient presents with   Ingrown Toenail    Right hallux medial border Duration approx 1 month Using peroxide, some epsom salt soaks Prediabetes, A1c~6, no tobacco    52 y.o. male presents with the above complaint. History confirmed with patient.  Has a history of this previously as well  Objective:  Physical Exam: warm, good capillary refill, no trophic changes or ulcerative lesions, normal DP and PT pulses, normal sensory exam, and ingrown hallux medial border slight paronychia.  Assessment:   1. Ingrowing right great toenail      Plan:  Patient was evaluated and treated and all questions answered.    Ingrown Nail, right -Patient elects to proceed with minor surgery to remove ingrown toenail today. Consent reviewed and signed by patient. -Ingrown nail excised. See procedure note. -Educated on post-procedure care including soaking. Written instructions provided and reviewed. -Rx for Cortisporin sent to pharmacy. -Advised on signs and symptoms of infection developing.  We discussed that the phenol likely will create some redness and edema and tenderness around the nailbed as long as it is localized this is to be expected.  Will return as needed if any infection signs develop  Procedure: Excision of Ingrown Toenail Location: Right 1st toe medial nail borders. Anesthesia: Lidocaine 1% plain; 1.5 mL and Marcaine 0.5% plain; 1.5 mL, digital block. Skin Prep: Betadine. Dressing: Silvadene; telfa; dry, sterile, compression dressing. Technique: Following skin prep, the toe was exsanguinated and a tourniquet was secured at the base of the toe. The affected nail border was freed, split with a nail splitter, and excised. Chemical matrixectomy was then performed with sodium hydroxide and irrigated out with acetic acid solution. The tourniquet was then removed and sterile dressing  applied. Disposition: Patient tolerated procedure well.    Return if symptoms worsen or fail to improve.

## 2023-02-01 NOTE — Patient Instructions (Addendum)

## 2023-02-15 ENCOUNTER — Other Ambulatory Visit (INDEPENDENT_AMBULATORY_CARE_PROVIDER_SITE_OTHER): Payer: Self-pay | Admitting: Physician Assistant

## 2023-02-15 DIAGNOSIS — E559 Vitamin D deficiency, unspecified: Secondary | ICD-10-CM

## 2023-03-09 ENCOUNTER — Ambulatory Visit (INDEPENDENT_AMBULATORY_CARE_PROVIDER_SITE_OTHER): Payer: BC Managed Care – PPO | Admitting: Physician Assistant

## 2023-03-09 ENCOUNTER — Encounter (INDEPENDENT_AMBULATORY_CARE_PROVIDER_SITE_OTHER): Payer: Self-pay | Admitting: Physician Assistant

## 2023-03-09 VITALS — BP 115/69 | HR 77 | Temp 98.3°F | Ht 72.5 in | Wt 341.0 lb

## 2023-03-09 DIAGNOSIS — E7849 Other hyperlipidemia: Secondary | ICD-10-CM

## 2023-03-09 DIAGNOSIS — E559 Vitamin D deficiency, unspecified: Secondary | ICD-10-CM

## 2023-03-09 DIAGNOSIS — E669 Obesity, unspecified: Secondary | ICD-10-CM

## 2023-03-09 DIAGNOSIS — I10 Essential (primary) hypertension: Secondary | ICD-10-CM

## 2023-03-09 DIAGNOSIS — E538 Deficiency of other specified B group vitamins: Secondary | ICD-10-CM | POA: Diagnosis not present

## 2023-03-09 DIAGNOSIS — R7303 Prediabetes: Secondary | ICD-10-CM | POA: Diagnosis not present

## 2023-03-09 DIAGNOSIS — E785 Hyperlipidemia, unspecified: Secondary | ICD-10-CM

## 2023-03-09 DIAGNOSIS — Z6841 Body Mass Index (BMI) 40.0 and over, adult: Secondary | ICD-10-CM

## 2023-03-09 MED ORDER — VITAMIN D (ERGOCALCIFEROL) 1.25 MG (50000 UNIT) PO CAPS
50000.0000 [IU] | ORAL_CAPSULE | ORAL | 0 refills | Status: DC
Start: 2023-03-09 — End: 2023-04-13

## 2023-03-09 MED ORDER — METFORMIN HCL 500 MG PO TABS
500.0000 mg | ORAL_TABLET | Freq: Two times a day (BID) | ORAL | 0 refills | Status: DC
Start: 2023-03-09 — End: 2023-04-13

## 2023-03-09 NOTE — Progress Notes (Signed)
.smr  Office: [phone]  /  Fax: [phone]  WEIGHT SUMMARY AND BIOMETRICS  Vitals Temp: 98.3 F (36.8 C) BP: 115/69 Pulse Rate: 77 SpO2: 97 %   Anthropometric Measurements Height: 6' 0.5" (1.842 m) Weight: (!) 341 lb (154.7 kg) BMI (Calculated): 45.59 Weight at Last Visit: 338 lb Weight Lost Since Last Visit: 0 Weight Gained Since Last Visit: 3 lb Starting Weight: 349 lb Total Weight Loss (lbs): 8 lb (3.629 kg) Peak Weight: 350 lb   Body Composition  Body Fat %: 39.3 % Fat Mass (lbs): 134.4 lbs Muscle Mass (lbs): 197.4 lbs Total Body Water (lbs): 141.4 lbs Visceral Fat Rating : 26   Other Clinical Data Fasting: yes Labs: no Today's Visit #: 26 Starting Date: 01/21/21     HPI  Chief Complaint: OBESITY  Kevin [name] is here to discuss his progress with his obesity treatment plan. He is on the the Category 4 Plan and states he is following his eating plan approximately 50 % of the time. He states he is exercising 0 minutes 0 times per week.   Interval History:  Since last office visit he is up 3 lbs.  He is recovering from surgery on his right great toe.  The patient, with a history of obesity, prediabetes, hypertension, and vitamin D deficiency, presents for a follow-up visit.  He recently underwent surgery on his right great toe, which has limited his activity level. The patient reports that his foot is healing well and he has been able to resume wearing regular shoes and light walking. However, he has not been able to engage in more strenuous exercise.  The patient has been trying to manage his diet by using meal delivery services like [employer] and [employer]. He reports that these services have helped him stay on track with his diet and have also promoted family involvement in meal preparation. Despite these efforts, the patient has noticed a slight increase in adipose tissue, likely due to his reduced activity level.  In addition to his obesity  treatment plan, the patient is also dealing with allergies. He has been taking Xyzal and Flonase to manage his symptoms, but reports that by the end of the day, the medication seems to wear off and he starts to experience headaches. He has been managing these headaches with Excedrin Migraine.  The patient is also taking metformin for his prediabetes and vitamin D supplements for his deficiency. He reports no major issues with these medications, although he initially experienced some stomach upset with the metformin. He is currently out of his vitamin D supplements.  Fasting labs were obtained today.  He was informed we would discuss his lab results at the next visit unless there is a critical issue that needs to be addressed sooner. He agreed to keep his next visit at the agreed upon time to discuss these results.   Pharmacotherapy:  metformin 500 mg twice daily. No GI or other side effects currently.   TREATMENT PLAN FOR OBESITY: Obesity:  Recent surgery on right great toe has limited activity level, contributing to slight increase in adipose tissue. Patient has been maintaining muscle mass well and has started light walking. Family is trying meal delivery services to improve diet. -Continue light walking as tolerated and increase activity as foot heals. -Continue with meal delivery services and ensure adequate protein intake. -Consider adding in resistance training with home weights. -Plan to use treadmill once it is moved to patient's home. Recommended Dietary Goals  Kevin [name] is currently  in the action stage of change. As such, his goal is to continue weight management plan. He has agreed to the Category 4 Plan.  Behavioral Intervention  We discussed the following Behavioral Modification Strategies today: continue to work on maintaining a reduced calorie state, getting the recommended amount of protein, incorporating whole foods, making healthy choices, staying well hydrated and practicing  mindfulness when eating..  Additional resources provided today: NA  Recommended Physical Activity Goals  Kevin [name] has been advised to work up to 150 minutes of moderate intensity aerobic activity a week and strengthening exercises 2-3 times per week for cardiovascular health, weight loss maintenance and preservation of muscle mass.   He has agreed to Think about enjoyable ways to increase daily physical activity and overcoming barriers to exercise and Increase physical activity in their day and reduce sedentary time (increase NEAT).   Pharmacotherapy We discussed various medication options to help Kevin [name] with his weight loss efforts and we both agreed to continue metformin 500 mg bid for prediabetes.    Return in about 5 weeks (around 04/13/2023).Marland Kitchen He was informed of the importance of frequent follow up visits to maximize his success with intensive lifestyle modifications for his multiple health conditions.  PHYSICAL EXAM:  Blood pressure 115/69, pulse 77, temperature 98.3 F (36.8 C), height 6' 0.5" (1.842 m), weight (!) 341 lb (154.7 kg), SpO2 97%. Body mass index is 45.61 kg/m.  General: He is overweight, cooperative, alert, well developed, and in no acute distress. PSYCH: Has normal mood, affect and thought process.   Cardiovascular: HR 70's BP 115/69 Lungs: Normal breathing effort, no conversational dyspnea. Neuro: grossly intact  DIAGNOSTIC DATA REVIEWED:  BMET    Component Value Date/Time   NA 140 06/28/2022 1254   K 4.2 06/28/2022 1254   CL 101 06/28/2022 1254   CO2 23 06/28/2022 1254   GLUCOSE 114 (H) 06/28/2022 1254   BUN 17 06/28/2022 1254   CREATININE 1.06 06/28/2022 1254   CALCIUM 9.5 06/28/2022 1254   Lab Results  Component Value Date   HGBA1C 6.0 (H) 06/28/2022   HGBA1C 6.2 (H) 01/21/2021   Lab Results  Component Value Date   INSULIN 38.5 (H) 06/28/2022   INSULIN 30.7 (H) 01/21/2021   Lab Results  Component Value Date   TSH 2.750 06/28/2022    CBC    Component Value Date/Time   WBC 6.7 06/28/2022 1254   RBC 4.96 06/28/2022 1254   HGB 15.3 06/28/2022 1254   HCT 44.9 06/28/2022 1254   PLT 280 06/28/2022 1254   MCV 91 06/28/2022 1254   MCH 30.8 06/28/2022 1254   MCHC 34.1 06/28/2022 1254   RDW 13.1 06/28/2022 1254   Iron Studies No results found for: "IRON", "TIBC", "FERRITIN", "IRONPCTSAT" Lipid Panel     Component Value Date/Time   CHOL 165 06/28/2022 1254   TRIG 177 (H) 06/28/2022 1254   HDL 52 06/28/2022 1254   LDLCALC 83 06/28/2022 1254   Hepatic Function Panel     Component Value Date/Time   PROT 7.1 06/28/2022 1254   ALBUMIN 4.5 06/28/2022 1254   AST 22 06/28/2022 1254   ALT 45 (H) 06/28/2022 1254   ALKPHOS 97 06/28/2022 1254   BILITOT 0.4 06/28/2022 1254      Component Value Date/Time   TSH 2.750 06/28/2022 1254   Nutritional Lab Results  Component Value Date   VD25OH 34.3 06/28/2022   VD25OH 39.9 07/06/2021   VD25OH 26.4 (L) 01/21/2021    ASSOCIATED CONDITIONS ADDRESSED TODAY  ASSESSMENT AND PLAN  Problem List Items Addressed This Visit     Prediabetes - Primary   Relevant Medications   metFORMIN (GLUCOPHAGE) 500 MG tablet   Other Relevant Orders   CMP14+EGFR   Hemoglobin A1c   Insulin, random   Vitamin D deficiency   Relevant Medications   Vitamin D, Ergocalciferol, (DRISDOL) 1.25 MG (50000 UNIT) CAPS capsule   Other Relevant Orders   VITAMIN D 25 Hydroxy (Vit-D Deficiency, Fractures)   Low serum vitamin B12   Relevant Orders   Vitamin B12   CBC with Differential/Platelet   Obesity (HCC)- start BMI-46.04   Relevant Medications   metFORMIN (GLUCOPHAGE) 500 MG tablet   Other hyperlipidemia   Relevant Orders   Lipid Panel With LDL/HDL Ratio    Prediabetes Patient is taking metformin with no reported side effects.  Lab Results  Component Value Date   HGBA1C 6.0 (H) 06/28/2022  Plan: -Continue/Refill  metformin 500 mg BID with meals. -Check fasting labs  today.  Hypertension On Zestorectic 10/12.5 mg daily Renal function normal range.  No side effects and BP consistently in goal range.  Plan: Continue Zestorectic, Continue to work on nutrition plan to promote weight loss and improve BP control. Recheck renal and lytes today.   Vitamin D Deficiency Vitamin D is not at goal of 50.  Most recent vitamin D level was 34.3. He is on  prescription ergocalciferol 50,000 IU weekly. Lab Results  Component Value Date   VD25OH 34.3 06/28/2022   VD25OH 39.9 07/06/2021   VD25OH 26.4 (L) 01/21/2021    Plan: Continue and refill  prescription ergocalciferol 50,000 IU weekly Low vitamin D levels can be associated with adiposity and may result in leptin resistance and weight gain. Also associated with fatigue. Currently on vitamin D supplementation without any adverse effects.  Recheck vitamin D level today.    Hyperlipidemia LDL is at goal. Medication(s): None Cardiovascular risk factors: dyslipidemia, hypertension, male gender, and obesity (BMI >= 30 kg/m2)  Lab Results  Component Value Date   CHOL 165 06/28/2022   HDL 52 06/28/2022   LDLCALC 83 06/28/2022   TRIG 177 (H) 06/28/2022   Lab Results  Component Value Date   ALT 45 (H) 06/28/2022   AST 22 06/28/2022   ALKPHOS 97 06/28/2022   BILITOT 0.4 06/28/2022   The 10-year ASCVD risk score (Arnett DK, et al., 2019) is: 2.5%   Values used to calculate the score:     Age: 52 years     Sex: Male     Is Non-Hispanic African American: No     Diabetic: No     Tobacco smoker: No     Systolic Blood Pressure: 115 mmHg     Is BP treated: Yes     HDL Cholesterol: 52 mg/dL     Total Cholesterol: 165 mg/dL  Plan: Continue to work on nutrition plan -decreasing simple carbohydrates, increasing lean proteins, decreasing saturated fats and cholesterol , avoiding trans fats and exercise as able to promote weight loss, improve lipids and decrease cardiovascular risks. Recheck fasting lipid panel  today.   Low serum B 12 level: Had been on B 12 500 mcg daily.  Lab Results  Component Value Date   VITAMINB12 370 06/28/2022   Plan: Recheck B 12 level and adjust supplement accordingly.   Right great toe post-operative status Patient had surgery for ingrown toenail, now healing with decreased pain. Patient has been soaking foot and using topical antibiotics. -Continue current wound care regimen.  Allergies Patient reports significant allergies, currently managed with Xyzal and Flonase. Patient reports fatigue, possibly related to Xyzal, and headaches. -Consider seeing allergy specialist for further management.  Follow-up Patient to return in approximately 4-6 weeks (scheduled for December 11th). -Review labs at next visit. -Discuss progress with exercise and diet changes. ATTESTASTION STATEMENTS:  Reviewed by clinician on day of visit: allergies, medications, problem list, medical history, surgical history, family history, social history, and previous encounter notes.   I have personally spent 32 minutes total time today in preparation, patient care, nutritional counseling and documentation for this visit, including the following: review of clinical lab tests; review of medical tests/procedures/services.      Kyleah P[place]abene, PA-C

## 2023-03-10 LAB — CMP14+EGFR
ALT: 51 [IU]/L — ABNORMAL HIGH (ref 0–44)
AST: 28 [IU]/L (ref 0–40)
Albumin: 4.5 g/dL (ref 4.1–5.1)
Alkaline Phosphatase: 103 [IU]/L (ref 44–121)
BUN/Creatinine Ratio: 15 (ref 9–20)
BUN: 14 mg/dL (ref 6–24)
Bilirubin Total: 0.7 mg/dL (ref 0.0–1.2)
CO2: 21 mmol/L (ref 20–29)
Calcium: 9.6 mg/dL (ref 8.7–10.2)
Chloride: 102 mmol/L (ref 96–106)
Creatinine, Ser: 0.93 mg/dL (ref 0.76–1.27)
Globulin, Total: 2.6 g/dL (ref 1.5–4.5)
Glucose: 124 mg/dL — ABNORMAL HIGH (ref 70–99)
Potassium: 4.2 mmol/L (ref 3.5–5.2)
Sodium: 140 mmol/L (ref 134–144)
Total Protein: 7.1 g/dL (ref 6.0–8.5)
eGFR: 100 mL/min/{1.73_m2} (ref >59)

## 2023-03-10 LAB — CBC WITH DIFFERENTIAL/PLATELET
Basophils Absolute: 0.1 10*3/uL (ref 0.0–0.2)
Basos: 1 %
EOS (ABSOLUTE): 0.2 10*3/uL (ref 0.0–0.4)
Eos: 2 %
Hematocrit: 45.7 % (ref 37.5–51.0)
Hemoglobin: 15.2 g/dL (ref 13.0–17.7)
Immature Grans (Abs): 0 10*3/uL (ref 0.0–0.1)
Immature Granulocytes: 0 %
Lymphocytes Absolute: 2 10*3/uL (ref 0.7–3.1)
Lymphs: 30 %
MCH: 30.1 pg (ref 26.6–33.0)
MCHC: 33.3 g/dL (ref 31.5–35.7)
MCV: 91 fL (ref 79–97)
Monocytes Absolute: 0.7 10*3/uL (ref 0.1–0.9)
Monocytes: 10 %
Neutrophils Absolute: 3.7 10*3/uL (ref 1.4–7.0)
Neutrophils: 57 %
Platelets: 285 10*3/uL (ref 150–450)
RBC: 5.05 x10E6/uL (ref 4.14–5.80)
RDW: 13 % (ref 11.6–15.4)
WBC: 6.6 10*3/uL (ref 3.4–10.8)

## 2023-03-10 LAB — VITAMIN D 25 HYDROXY (VIT D DEFICIENCY, FRACTURES): Vit D, 25-Hydroxy: 37.8 ng/mL (ref 30.0–100.0)

## 2023-03-10 LAB — LIPID PANEL WITH LDL/HDL RATIO
Cholesterol, Total: 179 mg/dL (ref 100–199)
HDL: 50 mg/dL (ref >39)
LDL Chol Calc (NIH): 101 mg/dL — ABNORMAL HIGH (ref 0–99)
LDL/HDL Ratio: 2 ratio (ref 0.0–3.6)
Triglycerides: 161 mg/dL — ABNORMAL HIGH (ref 0–149)
VLDL Cholesterol Cal: 28 mg/dL (ref 5–40)

## 2023-03-10 LAB — HEMOGLOBIN A1C
Est. average glucose Bld gHb Est-mCnc: 128 mg/dL
Hgb A1c MFr Bld: 6.1 % — ABNORMAL HIGH (ref 4.8–5.6)

## 2023-03-10 LAB — VITAMIN B12: Vitamin B-12: 461 pg/mL (ref 232–1245)

## 2023-03-10 LAB — INSULIN, RANDOM: INSULIN: 41.2 u[IU]/mL — ABNORMAL HIGH (ref 2.6–24.9)

## 2023-04-11 DIAGNOSIS — G4733 Obstructive sleep apnea (adult) (pediatric): Secondary | ICD-10-CM | POA: Diagnosis not present

## 2023-04-13 ENCOUNTER — Ambulatory Visit (INDEPENDENT_AMBULATORY_CARE_PROVIDER_SITE_OTHER): Payer: BC Managed Care – PPO | Admitting: Physician Assistant

## 2023-04-13 ENCOUNTER — Encounter (INDEPENDENT_AMBULATORY_CARE_PROVIDER_SITE_OTHER): Payer: Self-pay | Admitting: Physician Assistant

## 2023-04-13 VITALS — BP 128/73 | HR 76 | Temp 98.2°F | Ht 72.5 in | Wt 349.0 lb

## 2023-04-13 DIAGNOSIS — E669 Obesity, unspecified: Secondary | ICD-10-CM | POA: Diagnosis not present

## 2023-04-13 DIAGNOSIS — Z6841 Body Mass Index (BMI) 40.0 and over, adult: Secondary | ICD-10-CM

## 2023-04-13 DIAGNOSIS — E559 Vitamin D deficiency, unspecified: Secondary | ICD-10-CM | POA: Diagnosis not present

## 2023-04-13 DIAGNOSIS — R7303 Prediabetes: Secondary | ICD-10-CM | POA: Diagnosis not present

## 2023-04-13 DIAGNOSIS — I10 Essential (primary) hypertension: Secondary | ICD-10-CM

## 2023-04-13 MED ORDER — METFORMIN HCL 500 MG PO TABS: 500.0000 mg | ORAL_TABLET | Freq: Two times a day (BID) | ORAL | 0 refills | Status: DC

## 2023-04-13 MED ORDER — VITAMIN D (ERGOCALCIFEROL) 1.25 MG (50000 UNIT) PO CAPS: 50000.0000 [IU] | ORAL_CAPSULE | ORAL | 0 refills | Status: DC

## 2023-04-13 NOTE — Progress Notes (Signed)
SUBJECTIVE:  Chief Complaint: Obesity  Interim History: He is up 8 lbs since last visit.   Kevin [name] is a 52 year old gentleman with a history of prediabetes, insulin resistance, hyperlipidemia, and vitamin D and B12 deficiency, presents for a follow-up visit. Recent increase in weight, attributing it to the Thanksgiving holiday and a lack of intentional physical activity. 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   The patient acknowledges a lack of adherence to meal planning and prepping, but notes recent efforts to improve this, including the purchase of meal prep containers. He also mentions strategies such as buying rotisserie chicken in bulk and freezing it for future meals. 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  The patient's wife, who typically assists with meal planning and preparation, has recently been diagnosed with pneumonia, which has disrupted his usual routines. Despite these challenges, the patient reports feeling good during the day, with fatigue setting in during the evening.  In terms of medication, the patient confirms regular intake of prescribed Lipitor and atorvastatin for cholesterol management, as well as weekly vitamin D and daily B12 supplements. He also mentions a 90-day supply of metformin, which was last prescribed in October.  The patient's upcoming plans include a change in work position in January. We did discuss possible use of GLP-1 RA medication for multiple indications- hyperlipidemia, prediabetes with insulin resistance and fatty liver disease.   He expresses interest in exploring injectable medications for his prediabetes, cholesterol, and potential fatty liver disease in the new year.  Jettie is here to discuss his progress with his obesity treatment plan. He is on the Category 4 Plan  and states he is following his eating plan approximately 50 % of the time. He states he is exercising walking 4K steps daily while working minutes 7 times per week.   OBJECTIVE: Visit Diagnoses: Problem List Items Addressed This Visit     Prediabetes - Primary   Relevant Medications   metFORMIN (GLUCOPHAGE) 500 MG tablet   Vitamin D deficiency   Relevant Medications   Vitamin D, Ergocalciferol, (DRISDOL) 1.25 MG (50000 UNIT) CAPS capsule   Obesity (HCC)- start BMI-46.04   Relevant Medications   metFORMIN (GLUCOPHAGE) 500 MG tablet   BMI 45.0-49.9, adult (HCC)   Relevant Medications   metFORMIN (GLUCOPHAGE) 500 MG tablet   Essential hypertension  Obesity Weight neutral . Struggling with meal planning and prepping, but has some strategies to help with consistency with this.  We did discuss considering GLP-1 RA medications for medical weight loss as well as to address, NAFLD, hyperlipidemia and prediabetes with significant insulin resistance.    Prediabetes Lab Results  Component Value Date   HGBA1C 6.1 (H) 03/09/2023   HGBA1C 6.0 (H) 06/28/2022   HGBA1C 6.1 (H) 07/06/2021   Lab Results  Component Value Date   LDLCALC 101 (H) 03/09/2023   CREATININE 0.93 03/09/2023  Insulin level remains significantly elevated at 41.2 and worsening.   Prediabetes with elevated glucose and insulin resistance. Discussed meal planning, protein intake, and potential injectable medications (Ozempic, Wegovy) for blood sugar control, cholesterol, cardiovascular risk, and fatty liver. Side effects include nausea, queasiness, and constipation. Regular follow-up and lab checks every three months emphasized. - Refill metformin - Discuss injectable medications in the new year - Focus on protein intake and meal planning Continue working on nutrition plan to decrease  simple carbohydrates, increase lean proteins and exercise to promote weight loss, improve glycemic control and prevent progression to Type 2  diabetes.   Fatty Liver Disease Elevated ALT. Discussed potential benefits of injectable medications for reducing liver fat. Had [place] of ABD with findings consistent with MAFLD/NAFLD - Recheck liver function early next year - Discuss injectable medications in the new year Plan:  Lab results reviewed with patient.  Disease counseling done.  Intensive lifestyle modifications are the first line treatment for this issue.  We discussed several lifestyle modifications today and he will continue to work on diet, exercise and weight loss efforts.   Counseling: NAFLD is an umbrella term that encompasses a disease spectrum that includes steatosis (fat) without inflammation, steatohepatitis (NASH; fat + inflammation in a characteristic pattern), and cirrhosis. Bland steatosis is felt to be a benign condition, with extremely low to no risk of progression to cirrhosis, whereas NASH can progress to cirrhosis. The mainstay of treatment of NAFLD includes lifestyle modification to achieve weight loss, at least 7% of current body weight. Low carbohydrate diets can be beneficial in improving NAFLD liver histology. Additionally, exercise, even the absence of weight loss can have beneficial effects on the patient's metabolic profile and liver health. We recommend that their metabolic comorbidities be aggressively managed, as patients with NAFLD are at increased risk of coronary artery disease.  Hyperlipidemia The 10-year ASCVD risk score (Arnett DK, et al., 2019) is: 3.5%   Values used to calculate the score:     Age: 77 years     Sex: Male     Is Non-Hispanic African American: No     Diabetic: No     Tobacco smoker: No     Systolic Blood Pressure: 128 mmHg     Is BP treated: Yes     HDL Cholesterol: 50 mg/dL     Total Cholesterol: 179 mg/dL  Elevated LDL and triglycerides. Currently on atorvastatin without issues. Discussed potential benefits of injectable medications for cholesterol management, weight loss,  and cardiovascular risk reduction. - Continue atorvastatin as prescribed by PCP.  - Discuss injectable medications in the new year. May be candidate for [facility] for hyperlipidemia to lower CV risk further.    Vitamin D Deficiency Vitamin D is not at goal of 50.  Most recent vitamin D level was 37.8. He is on  prescription ergocalciferol 50,000 IU weekly. No N/V or muscle weakness with Ergocalciferol.  Lab Results  Component Value Date   VD25OH 37.8 03/09/2023   VD25OH 34.3 06/28/2022   VD25OH 39.9 07/06/2021    Plan: Continue and refill  prescription ergocalciferol 50,000 IU weekly Low vitamin D levels can be associated with adiposity and may result in leptin resistance and weight gain. Also associated with fatigue. Currently on vitamin D supplementation without any adverse effects.   B12 Deficiency Vitamin 12 levels improved; continue supplementation. B12 taken daily as a gummy and level improving.   - Continue daily B12 supplementation  General Health Maintenance Emphasized hydration, protein intake, physical activity, and mindful eating during the holiday season. - Focus on hydration and protein intake - Continue physical activity  Follow-up - Follow-up appointment on January 16th at 4 PM.  Vitals Temp: 98.2 F (36.8 C) BP: 128/73 Pulse Rate: 76 SpO2: 98 %   Anthropometric Measurements Height: 6' 0.5" (1.842 m) Weight: (!) 349 lb (158.3 kg) BMI (Calculated): 46.66 Weight at Last Visit: 341 lb Weight Lost Since Last Visit: 0 Weight Gained Since Last Visit: 8 lb Starting Weight:  349 lb Total Weight Loss (lbs): 0 lb (0 kg) Peak Weight: 350 lb   Body Composition  Body Fat %: 40.4 % Fat Mass (lbs): 141 lbs Muscle Mass (lbs): 198 lbs Total Body Water (lbs): 145.4 lbs Visceral Fat Rating : 27   Other Clinical Data Fasting: yes Labs: no Today's Visit #: 27 Starting Date: 01/21/21     ASSESSMENT AND PLAN:  Diet: Wilkie is currently in the action stage  of change. As such, his goal is to continue with weight loss efforts. He has agreed to Category 4 Plan.  Exercise: Crystal has been instructed to work up to a goal of 150 minutes of combined cardio and strengthening exercise per week for weight loss and overall health benefits.   Behavior Modification:  We discussed the following Behavioral Modification Strategies today: increasing lean protein intake, decreasing simple carbohydrates, increasing vegetables, increase H2O intake, increase high fiber foods, meal planning and cooking strategies, holiday eating strategies, and planning for success. We discussed various medication options to help Jadie with his weight loss efforts and we both agreed to continue metformin for primary indications of prediabetes/insulin resistance.  Return in about 5 weeks (around 05/18/2023).Marland Kitchen He was informed of the importance of frequent follow up visits to maximize his success with intensive lifestyle modifications for his multiple health conditions.  Attestation Statements:   Reviewed by clinician on day of visit: allergies, medications, problem list, medical history, surgical history, family history, social history, and previous encounter notes.   Time spent on visit including pre-visit chart review and post-visit care and charting was 37 minutes.    Ellysa Parrack, PA-C

## 2023-05-19 ENCOUNTER — Ambulatory Visit (INDEPENDENT_AMBULATORY_CARE_PROVIDER_SITE_OTHER): Payer: BC Managed Care – PPO | Admitting: Physician Assistant

## 2023-05-23 ENCOUNTER — Ambulatory Visit (INDEPENDENT_AMBULATORY_CARE_PROVIDER_SITE_OTHER): Payer: BC Managed Care – PPO | Admitting: Family Medicine

## 2023-05-23 ENCOUNTER — Encounter (INDEPENDENT_AMBULATORY_CARE_PROVIDER_SITE_OTHER): Payer: Self-pay | Admitting: Family Medicine

## 2023-05-23 VITALS — BP 139/71 | HR 77 | Temp 98.4°F | Ht 73.0 in | Wt 354.0 lb

## 2023-05-23 DIAGNOSIS — K760 Fatty (change of) liver, not elsewhere classified: Secondary | ICD-10-CM | POA: Diagnosis not present

## 2023-05-23 DIAGNOSIS — G4733 Obstructive sleep apnea (adult) (pediatric): Secondary | ICD-10-CM | POA: Diagnosis not present

## 2023-05-23 DIAGNOSIS — Z6841 Body Mass Index (BMI) 40.0 and over, adult: Secondary | ICD-10-CM

## 2023-05-23 DIAGNOSIS — E669 Obesity, unspecified: Secondary | ICD-10-CM | POA: Diagnosis not present

## 2023-05-23 MED ORDER — ZEPBOUND 2.5 MG/0.5ML ~~LOC~~ SOAJ: 2.5000 mg | SUBCUTANEOUS | 0 refills | Status: DC

## 2023-05-23 NOTE — Assessment & Plan Note (Signed)
The total APNEA/HYPOPNEA INDEX (AHI) was 53.8 /hour and the total RESPIRATORY DISTURBANCE INDEX was 53.8 /hour.  44 events occurred in REM sleep and 194 events in NREM. The REM AHI was 69.5 /hour versus a non-REM AHI of 49.5 /hour. The patient spent 246.5 minutes sleep time in the supine position 83 minutes in non-supine. The supine AHI was 73.7 /hour versus a non-supine AHI of 31.8 /hour done on sleep study April 2023.  Patient has been consistent with CPAP usage since that time. Prescribing Zepbound today.

## 2023-05-23 NOTE — Assessment & Plan Note (Signed)
Patient has establish care in our clinic starting in 2022.  He has worked on lifestyle changes which include dietary changes with focus on calorie and protein intake based on the structured meal plan of category 4.  He is a active hiker who unfortunately has been limited his activity due to a recent tailbone injury late last summer early last fall.  He has been slightly more indulging over the holidays and voices he was feeling sorry for himself given that injury.  He is ready to recommit to the structured category 4 plan but does not need copies of the handouts.  We talked about making sure food is in the house and he states he will be able to fill his refrigerator and pantry with category 4 foods within the next few days.  Will follow-up at next appointment to see whether or not patient needs further changes to the meal plan based on dietary likes/dislikes as well as changes in his work schedule.

## 2023-05-23 NOTE — Assessment & Plan Note (Signed)
Last ALT elevated at 51, AST and alkaline phosphatase within normal limits.  Patient has had elevated LFTs for prior lab.  Patient has been focusing on lifestyle changes which include dietary changes.  He is going to recommit to Category 4.

## 2023-05-23 NOTE — Progress Notes (Signed)
SUBJECTIVE:  Chief Complaint: Obesity  Interim History: Patient stayed local for the holidays and hosted the family.  He is recovering from a bruised tailbone in the fall.  The holidays deterred patient from following plan.  He lost his motivation with his injury and following meal plan. Patient started a new position in January and is transitioning currently to a different time schedule.   Kevin [name] is here to discuss his progress with his obesity treatment plan. He is on the Category 4 Plan and states he is following his eating plan approximately 50 % of the time. He states he is not exercising, but was walking.   OBJECTIVE: Visit Diagnoses: Problem List Items Addressed This Visit       Respiratory   OSA (obstructive sleep apnea) - Primary   The total APNEA/HYPOPNEA INDEX (AHI) was 53.8 /hour and the total RESPIRATORY DISTURBANCE INDEX was 53.8 /hour.  44 events occurred in REM sleep and 194 events in NREM. The REM AHI was 69.5 /hour versus a non-REM AHI of 49.5 /hour. The patient spent 246.5 minutes sleep time in the supine position 83 minutes in non-supine. The supine AHI was 73.7 /hour versus a non-supine AHI of 31.8 /hour done on sleep study April 2023.  Patient has been consistent with CPAP usage since that time. Prescribing Zepbound today.      Relevant Medications   tirzepatide (ZEPBOUND) 2.5 MG/0.5ML Pen     Digestive   Fatty liver   Last ALT elevated at 51, AST and alkaline phosphatase within normal limits.  Patient has had elevated LFTs for prior lab.  Patient has been focusing on lifestyle changes which include dietary changes.  He is going to recommit to Category 4.        Other   Obesity (HCC)- start BMI-46.04   Patient has establish care in our clinic starting in 2022.  He has worked on lifestyle changes which include dietary changes with focus on calorie and protein intake based on the structured meal plan of category 4.  He is a active hiker who unfortunately has  been limited his activity due to a recent tailbone injury late last summer early last fall.  He has been slightly more indulging over the holidays and voices he was feeling sorry for himself given that injury.  He is ready to recommit to the structured category 4 plan but does not need copies of the handouts.  We talked about making sure food is in the house and he states he will be able to fill his refrigerator and pantry with category 4 foods within the next few days.  Will follow-up at next appointment to see whether or not patient needs further changes to the meal plan based on dietary likes/dislikes as well as changes in his work schedule.      Relevant Medications   tirzepatide (ZEPBOUND) 2.5 MG/0.5ML Pen   BMI 45.0-49.9, adult (HCC)   Relevant Medications   tirzepatide (ZEPBOUND) 2.5 MG/0.5ML Pen    Vitals Temp: 98.4 F (36.9 C) BP: 139/71 Pulse Rate: 77 SpO2: 97 %   Anthropometric Measurements Height: 6\' 1"  (1.854 m) Weight: (!) 354 lb (160.6 kg) BMI (Calculated): 46.71 Weight at Last Visit: 349 lb Weight Lost Since Last Visit: 0 Weight Gained Since Last Visit: 5 Starting Weight: 349 lbs Total Weight Loss (lbs): 0 lb (0 kg)   Body Composition  Body Fat %: 40.6 % Fat Mass (lbs): 144 lbs Muscle Mass (lbs): 200.4 lbs Total Body Water (lbs): 148.8  lbs Visceral Fat Rating : 27   Other Clinical Data Today's Visit #: 28 Starting Date: 01/21/21     ASSESSMENT AND PLAN:  Diet: Kevin [name] is currently in the action stage of change. As such, his goal is to continue with weight loss efforts. He has agreed to Category 4 Plan.  Patient voices difficulty adhering to meal plan previously due to the holidays we will recommit to category 4.  Exercise: Kevin [name] has been instructed that some exercise is better than none for weight loss and overall health benefits.   Behavior Modification:  We discussed the following Behavioral Modification Strategies today: increasing lean  protein intake, increasing vegetables, meal planning and cooking strategies, better snacking choices, avoiding temptations, and planning for success.   No follow-ups on file.Marland Kitchen He was informed of the importance of frequent follow up visits to maximize his success with intensive lifestyle modifications for his multiple health conditions.  Attestation Statements:   Reviewed by clinician on day of visit: allergies, medications, problem list, medical history, surgical history, family history, social history, and previous encounter notes.     Reuben Likes, MD

## 2023-05-24 ENCOUNTER — Ambulatory Visit (INDEPENDENT_AMBULATORY_CARE_PROVIDER_SITE_OTHER): Payer: BC Managed Care – PPO | Admitting: Internal Medicine

## 2023-05-24 ENCOUNTER — Encounter (INDEPENDENT_AMBULATORY_CARE_PROVIDER_SITE_OTHER): Payer: Self-pay

## 2023-06-15 NOTE — Progress Notes (Signed)
 PATIENT: Kevin [name] DOB: 1970/10/04  REASON FOR VISIT: follow up HISTORY FROM: patient  Virtual Visit via Telephone Note  I connected with Benay Spice on 06/21/23 at  8:00 AM EST by telephone and verified that I am speaking with the correct person using two identifiers.   I discussed the limitations, risks, security and privacy concerns of performing an evaluation and management service by telephone and the availability of in person appointments. I also discussed with the patient that there may be a patient responsible charge related to this service. The patient expressed understanding and agreed to proceed.   History of Present Illness:  06/21/23 ALL (Mychart): Kevin [name] returns for follow up for OSA on CPAP. He continues to do well on therapy. He is using CPAP nightly for about 7 hours, on average. He denies concerns with machine or supplies. He does report supply cost are usually very expensive until deductible met.     06/01/2022 ALL (Mychart):  Kevin [name] is a 53 y.o. male here today for follow up for OSA on CPAP. He is adjusting well to CPAP therapy. He is using his machine every night for 7-8 hours. He does have some discomfort of ears when having allergy symptoms. He has not changed out mask recently dure to cost. He is planning to order supplies online. He has not been bothered by leak. He is feeling well and without concerns.     History (copied from Dr Teofilo Pod previous note)  Mr. Kevin [name] is a 53 year old right-handed gentleman with an underlying medical history of hypertension, hyperlipidemia, fatty liver, shortness of breath, and severe obesity with a BMI of over 40, who Who presents for follow-up consultation of his obstructive sleep apnea after interim testing and starting CPAP therapy.  The patient is unaccompanied today.  I first met him at the request of Dr. Lawson Radar on 07/01/2021, at which time he reported snoring and excessive daytime somnolence as well as  witnessed apneas.  He was advised to proceed with a sleep study.  He had a laboratory attended sleep study on 08/04/2021 and qualified for an emergency split-night study due to severe sleep apnea.  Baseline sleep efficiency was 66%, sleep latency 70 minutes, REM latency 65 minutes.  Overall AHI was 53.8/h, average oxygen saturation was 94%, nadir was 66% during supine REM sleep.  He was placed on CPAP therapy via Evora fullface mask from [employer], size large centimeters, titrated to a final pressure of 16 cm, at which time his AHI was 15.8/h, O2 nadir 85% with supine non-REM sleep achieved.  Based on his test results I prescribed home BiPAP therapy empirically but the insurance did not cover BiPAP therapy.  I prescribed home CPAP therapy at 16 centimeters.  He has a ResMed AirSense 10 AutoSet machine, set up date was 08/28/2021.   Today, 11/09/2021: I reviewed his CPAP compliance data from 10/06/2021 through 11/04/2021, which is a total of 30 days, during which time he used his machine 29 days with percent use days greater than 4 hours at 97%, indicating excellent compliance with an average usage of 7 hours and 30 minutes, residual AHI at goal at 1.9/h, leak on the higher side with the 95th percentile at 28.5 L/min on a pressure of 16 cm with EPR of 3.  He reports doing fairly well with his CPAP, did have initial adjustment difficulty especially with the higher pressure, he had dry mouth and sore throat fairly consistently in the first week or so.  He  tolerates the full facemask but is a side sleeper and sometimes notices the mask dislodging.  He just returned from a trip to [place] with the [employer] and took his machine with him.  He is very motivated to continue with treatment, snoring is much better per wife's feedback and nocturia and middle of the night awakenings have improved also. Of note, he has been working on weight loss and has lost a good 10 pounds since our first visit. Has had some weight  fluctuations, have gone down to 324 pounds for his lowest in the recent past.   Observations/Objective:  Generalized: Well developed, in no acute distress  Mentation: Alert oriented to time, place, history taking. Follows all commands speech and language fluent   Assessment and Plan:  53 y.o. year old male  has a past medical history of Fatty liver, Hyperlipidemia, and Hypertension. here with    ICD-10-CM   1. OSA on CPAP  G47.33 For home use only DME continuous positive airway pressure (CPAP)      Kevin [name] is doing well. Compliance report shows excellent compliance. He was encouraged to continue using CPAP nightly for at least 4 hours. He will change supplies as directed. Monitor for leak at home. Supply orders updated. May order online if cost is a concern. Healthy lifestyle habits encouraged. He will follow up in 1 year, sooner if needed.   Orders Placed This Encounter  Procedures   For home use only DME continuous positive airway pressure (CPAP)    Heated Humidity with all supplies as needed    Length of Need:   Lifetime    Patient has OSA or probable OSA:   Yes    Is the patient currently using CPAP in the home:   Yes    Settings:   Other see comments    CPAP supplies needed:   Mask, headgear, cushions, filters, heated tubing and water chamber    No orders of the defined types were placed in this encounter.    Follow Up Instructions:  I discussed the assessment and treatment plan with the patient. The patient was provided an opportunity to ask questions and all were answered. The patient agreed with the plan and demonstrated an understanding of the instructions.   The patient was advised to call back or seek an in-person evaluation if the symptoms worsen or if the condition fails to improve as anticipated.  I provided 15 minutes of non-face-to-face time during this encounter. Patient located at their place of residence during Mychart visit. Provider is in the office.     Shawnie Dapper, NP

## 2023-06-15 NOTE — Patient Instructions (Signed)

## 2023-06-20 ENCOUNTER — Encounter: Payer: Self-pay | Admitting: *Deleted

## 2023-06-20 NOTE — Progress Notes (Unsigned)
 Marland Kitchen

## 2023-06-21 ENCOUNTER — Encounter (INDEPENDENT_AMBULATORY_CARE_PROVIDER_SITE_OTHER): Payer: Self-pay | Admitting: Physician Assistant

## 2023-06-21 ENCOUNTER — Telehealth: Payer: BC Managed Care – PPO | Admitting: Family Medicine

## 2023-06-21 ENCOUNTER — Ambulatory Visit (INDEPENDENT_AMBULATORY_CARE_PROVIDER_SITE_OTHER): Payer: BC Managed Care – PPO | Admitting: Physician Assistant

## 2023-06-21 ENCOUNTER — Encounter: Payer: Self-pay | Admitting: Family Medicine

## 2023-06-21 VITALS — BP 121/77 | HR 75 | Temp 98.1°F | Ht 73.0 in | Wt 341.0 lb

## 2023-06-21 DIAGNOSIS — G4733 Obstructive sleep apnea (adult) (pediatric): Secondary | ICD-10-CM

## 2023-06-21 DIAGNOSIS — R7303 Prediabetes: Secondary | ICD-10-CM

## 2023-06-21 DIAGNOSIS — E7849 Other hyperlipidemia: Secondary | ICD-10-CM

## 2023-06-21 DIAGNOSIS — E559 Vitamin D deficiency, unspecified: Secondary | ICD-10-CM | POA: Diagnosis not present

## 2023-06-21 DIAGNOSIS — Z6841 Body Mass Index (BMI) 40.0 and over, adult: Secondary | ICD-10-CM

## 2023-06-21 DIAGNOSIS — E669 Obesity, unspecified: Secondary | ICD-10-CM

## 2023-06-21 MED ORDER — VITAMIN D (ERGOCALCIFEROL) 1.25 MG (50000 UNIT) PO CAPS: 50000.0000 [IU] | ORAL_CAPSULE | ORAL | 0 refills | Status: DC

## 2023-06-21 MED ORDER — TIRZEPATIDE-WEIGHT MANAGEMENT 5 MG/0.5ML ~~LOC~~ SOLN: 5.0000 mg | SUBCUTANEOUS | 0 refills | Status: DC

## 2023-06-21 NOTE — Progress Notes (Signed)
 SUBJECTIVE: Discussed the use of AI scribe software for clinical note transcription with the patient, who gave verbal consent to proceed.  Chief Complaint: Obesity  Interim History: He is down 13 lbs since last visit. Started Zepbound 2.5 mg weekly 05/23/23  Kevin [name] is here to discuss his progress with his obesity treatment plan. He is on the Category 4 Plan and states he is following his eating plan approximately 75 % of the time. He states he is exercising Gym/walking 60 minutes 2 times per week.  Kevin [name] "Kevin [name]" is a 53 year old male who presents for follow-up of his obesity treatment plan.  He has been on Zepbound 2.5 mg weekly for obesity and has lost 13 pounds since his last visit. Hunger cravings have decreased significantly. Initially, he experienced constipation, which has since resolved. No nausea or other significant side effects have been noted. He continues to take metformin alongside Zepbound.  He has obstructive sleep apnea and has excellent compliance with his CPAP. He met with his sleep study provider, who confirmed that his sleep metrics are within good range, with only 0.3 events per night. He has been advised to purchase CPAP supplies from alternative sources due to insurance coverage issues.  He has started a meal prep routine with his brother-in-law, which includes meals with less than 600 calories and at least 40 grams of protein. Occasionally, he skips meals due to reduced appetite but is trying to maintain regular eating habits. He has also been going to the gym on weekends with his brother-in-law.  His past medical history includes prediabetes, vitamin D deficiency, vitamin B12 deficiency, hyperlipidemia, and hypertension. He is currently taking vitamin D supplements as his levels are still not in the target range. He does not require a refill for metformin as he receives a 90-day supply. OBJECTIVE: Visit Diagnoses: Problem List Items Addressed This Visit      Prediabetes   Vitamin D deficiency   Relevant Medications   Vitamin D, Ergocalciferol, (DRISDOL) 1.25 MG (50000 UNIT) CAPS capsule   Obesity (HCC)- start BMI-46.04   Relevant Medications   tirzepatide 5 MG/0.5ML injection vial   BMI 45.0-49.9, adult (HCC)   Relevant Medications   tirzepatide 5 MG/0.5ML injection vial   Other hyperlipidemia   OSA (obstructive sleep apnea) - Primary   Relevant Medications   tirzepatide 5 MG/0.5ML injection vial  Obesity Kevin [name], a 52 year old male, has lost 13 pounds on 2.5 mg of Zepbound weekly. Reports reduced hunger cravings and resolved initial constipation. Discussed potential muscle mass loss with weight loss and emphasized protein intake. Explained Zepbound's effect on gut motility and potential constipation. Denies mass in neck, dysphagia, dyspepsia, persistent hoarseness, abdominal pain, or N/V/Constipation or diarrhea. Has annual eye exam. Mood is stable.   Discussed increasing Zepbound to 5 mg weekly, noting the 2.5 mg dose is a loading dose. Informed him of increased appetite suppression and the need for smaller, frequent meals. Advised hydration and avoiding fatty foods to minimize side effects. Will revert to 2.5 mg if insurance does not cover the increased dose. - Increase Zepbound to 5 mg weekly - Monitor for side effects and adjust dosage if necessary - Encourage consistent protein intake to prevent muscle mass loss - Advise staying hydrated and avoiding fatty foods to minimize side effects   Obstructive Sleep Apnea Kevin [name] uses a CPAP machine and recent follow-up with his sleep specialist showed normal sleep metrics. Discussed high cost of CPAP supplies and advised looking for affordable options online.  Mentioned weight loss might reduce the need for CPAP therapy. - Continue CPAP therapy Continue and increase Zepbound to 5 mg weekly for primary indication of sleep apnea.   Prediabetes Kevin [name] continues to manage prediabetes with metformin.  No GI or other side effects with metformin. Started Zepbound which will also aid in glycemic control.  - Continue metformin as prescribed Increase Zepbound to 5 mg weekly.  Continue working on nutrition plan to decrease simple carbohydrates, increase lean proteins and exercise to promote weight loss, improve glycemic control and prevent progression to Type 2 diabetes.   Hyperlipidemia Weight loss from Zepbound likely to improve cholesterol levels and reduce cardiovascular risk. Future lab work will monitor these improvements. - Monitor cholesterol levels in future visits Continue to work on nutrition plan -decreasing simple carbohydrates, increasing lean proteins, decreasing saturated fats and cholesterol , avoiding trans fats and exercise as able to promote weight loss, improve lipids and decrease cardiovascular risks.   Vitamin D Deficiency On Ergocalciferol 50,000 units weekly. No N/V or muscle weakness with Ergo.  Last vitamin D Lab Results  Component Value Date   VD25OH 37.8 03/09/2023    Kevin [name]'s vitamin D levels are in the 30s, below target range of 50-70. Recommended continuing vitamin D supplementation. - Refill ergocalciferol 50,000 units weekly. Low vitamin D levels can be associated with adiposity and may result in leptin resistance and weight gain. Also associated with fatigue.  Currently on vitamin D supplementation without any adverse effects such as nausea, vomiting or muscle weakness.    General Health Maintenance Kevin [name] engages in meal prep and gym activities with his brother-in-law, aiding weight loss and overall health. Advised regular small meals to maintain energy levels and prevent nausea. Emphasized protein intake with increased Zepbound dosage. - Encourage continued meal prep and gym activities - Advise regular small meals to maintain energy levels and prevent nausea  Follow-up - Schedule follow-up appointment in one month - Consider early morning appointment for  lab work in a couple of months.  Vitals Temp: 98.1 F (36.7 C) BP: 121/77 Pulse Rate: 75 SpO2: 94 %   Anthropometric Measurements Height: 6\' 1"  (1.854 m) Weight: (!) 341 lb (154.7 kg) BMI (Calculated): 45 Weight at Last Visit: 354lb Weight Lost Since Last Visit: 13lb Weight Gained Since Last Visit: 0 Starting Weight: 349lb Total Weight Loss (lbs): 8 lb (3.629 kg)   Body Composition  Body Fat %: 39.5 % Fat Mass (lbs): 134.6 lbs Muscle Mass (lbs): 196.4 lbs Total Body Water (lbs): 146 lbs Visceral Fat Rating : 26   Other Clinical Data Fasting: no Labs: no Today's Visit #: 29 Starting Date: 01/21/21     ASSESSMENT AND PLAN:  Diet: Tania is currently in the action stage of change. As such, his goal is to continue with weight loss efforts. He has agreed to Category 4 Plan.  Exercise: Blain has been instructed to work up to a goal of 150 minutes of combined cardio and strengthening exercise per week for weight loss and overall health benefits.   Behavior Modification:  We discussed the following Behavioral Modification Strategies today: increasing lean protein intake, decreasing simple carbohydrates, increasing vegetables, increase H2O intake, increase high fiber foods, no skipping meals, avoiding temptations, and planning for success. We discussed various medication options to help Sergey with his weight loss efforts and we both agreed to increase Zepbound to 5 mg weekly for primary indication of sleep apnea.  Return in about 4 weeks (around 07/19/2023).Marland Kitchen He was informed of the importance  of frequent follow up visits to maximize his success with intensive lifestyle modifications for his multiple health conditions.  Attestation Statements:   Reviewed by clinician on day of visit: allergies, medications, problem list, medical history, surgical history, family history, social history, and previous encounter notes.   Time spent on visit including pre-visit chart  review and post-visit care and charting was 40 minutes.    Antoinette Haskett, PA-C

## 2023-06-23 ENCOUNTER — Other Ambulatory Visit: Payer: Self-pay | Admitting: Medical Genetics

## 2023-07-15 ENCOUNTER — Other Ambulatory Visit: Payer: BC Managed Care – PPO

## 2023-07-15 DIAGNOSIS — Z006 Encounter for examination for normal comparison and control in clinical research program: Secondary | ICD-10-CM

## 2023-07-21 ENCOUNTER — Encounter (INDEPENDENT_AMBULATORY_CARE_PROVIDER_SITE_OTHER): Payer: Self-pay | Admitting: Physician Assistant

## 2023-07-21 ENCOUNTER — Ambulatory Visit (INDEPENDENT_AMBULATORY_CARE_PROVIDER_SITE_OTHER): Payer: BC Managed Care – PPO | Admitting: Physician Assistant

## 2023-07-21 VITALS — BP 110/70 | HR 76 | Temp 97.5°F | Ht 73.0 in | Wt 331.0 lb

## 2023-07-21 DIAGNOSIS — E559 Vitamin D deficiency, unspecified: Secondary | ICD-10-CM

## 2023-07-21 DIAGNOSIS — Z6841 Body Mass Index (BMI) 40.0 and over, adult: Secondary | ICD-10-CM

## 2023-07-21 DIAGNOSIS — R7303 Prediabetes: Secondary | ICD-10-CM | POA: Diagnosis not present

## 2023-07-21 DIAGNOSIS — G4733 Obstructive sleep apnea (adult) (pediatric): Secondary | ICD-10-CM

## 2023-07-21 DIAGNOSIS — E669 Obesity, unspecified: Secondary | ICD-10-CM | POA: Diagnosis not present

## 2023-07-21 MED ORDER — TIRZEPATIDE-WEIGHT MANAGEMENT 5 MG/0.5ML ~~LOC~~ SOLN: 5.0000 mg | SUBCUTANEOUS | 0 refills | Status: DC

## 2023-07-21 NOTE — Progress Notes (Signed)
 SUBJECTIVE: Discussed the use of AI scribe software for clinical note transcription with the patient, who gave verbal consent to proceed.  Chief Complaint: Obesity  Interim History: He is down 10 lbs since last visit.   Down 18 lbs overall   TBW loss of 5.2%  Kevin [name] is here to discuss his progress with his obesity treatment plan. He is on the Category 4 Plan and states he is following his eating plan approximately 90-100 % of the time. He states he is exercising walking 20 minutes 2 times per week.  Kevin [name] "Kevin [name]" is a 53 year old male who presents for follow-up on his treatment plan.  He has a history of obesity and is adhering to a category four weight loss plan approximately 70% of the time. He has lost a total of 18 pounds, with 10 pounds lost since his last visit. He engages in physical activity by walking for 30 minutes two days weekly. The weight loss has improved his portion control, and he often eats because it is time to eat rather than due to hunger. He is focusing on meeting his protein needs by incorporating protein-rich foods like protein yogurts into his diet. He has noticed a decrease in cravings for snack foods, particularly potato chips and cookies, which were previously a significant part of his diet.  He has prediabetes and is currently taking metformin once a day, although he was previously instructed to take it twice daily. He forgot about the twice-daily dosing after starting a new medication. He experienced some gastrointestinal symptoms initially, including loose stools and minor bleeding, which resolved after a few days. No changes in vision or other unusual symptoms.  He has a history of obstructive sleep apnea with continued symptoms. No new symptoms related to this condition were reported during the visit.  He has a history of vitamin D deficiency and is taking vitamin D supplements without any reported issues. He is also taking over-the-counter vitamin  B12 gummies to support absorption, as metformin can affect B12 levels. OBJECTIVE: Visit Diagnoses: Problem List Items Addressed This Visit     Prediabetes   Vitamin D deficiency   Obesity (HCC)- start BMI-46.04   Relevant Medications   tirzepatide 5 MG/0.5ML injection vial   OSA (obstructive sleep apnea) - Primary   Relevant Medications   tirzepatide 5 MG/0.5ML injection vial   Other Visit Diagnoses       BMI 40.0-44.9, adult (HCC) Current BMI 43.8       Relevant Medications   tirzepatide 5 MG/0.5ML injection vial     Obesity Kevin [name] is adhering to a category four weight loss plan approximately 70% of the time, resulting in an 18-pound weight loss, with 10 pounds lost since the last visit.  He is experiencing a reduction in adipose tissue and some muscle mass loss, which is expected with faster weight loss.  He is not experiencing significant side effects from the current treatment, and his portion control has improved.  He is meeting his protein needs by focusing on protein intake and using protein-rich foods like yogurt.  The decision was made to maintain the current medication dose due to the positive weight loss results and minimal side effects. The anticipated outcome is continued weight loss, potentially reaching 58-60 pounds over the next few months if the current rate is maintained. - Continue current weight loss plan and medication dosage. - Encourage regular exercise to maintain muscle mass. - Focus on protein intake to support muscle maintenance.  Obstructive Sleep Apnea Kevin [name] has obstructive sleep apnea with continued symptoms. Good compliance with CPAP therapy.  The weight loss may positively impact his sleep apnea symptoms. Began Zepbound for primary indication of OSA with good results thus far.  Down total of 18 lbs TBW loss of 5.2% since starting Zepbound.  Plan: Continue CPAP therapy.  Continue Zepbound 5 mg weekly Meds ordered this encounter  Medications    tirzepatide 5 MG/0.5ML injection vial    Sig: Inject 5 mg into the skin once a week.    Dispense:  2 mL    Refill:  0     Prediabetes Kevin [name] is currently taking metformin once daily, reduced from twice daily due to initial loose stools, which have resolved. Monitoring the effects of weight loss and medication on his A1c and insulin levels is planned. - Continue metformin once daily. Continue Zepbound for primary indication of OSA.  - Recheck labs next 1-2 months to assess A1c, insulin levels, and lipid profile.  Vitamin D Deficiency Kevin [name] is taking Ergocalciferol 50,000 units once weekly without any issues. The current supplementation is deemed adequate for the next few months. - Continue current  Ergocalciferol 50,000 units once weekly. Low vitamin D levels can be associated with adiposity and may result in leptin resistance and weight gain. Also associated with fatigue.  Currently on vitamin D supplementation without any adverse effects such as nausea, vomiting or muscle weakness.  Recheck vitamin D level in next 1-2 months.   General Health Maintenance Kevin [name] is taking vitamin B12 supplements due to potential absorption issues related to metformin use. Continuing this supplementation is advised to ensure adequate B12 levels. - Continue vitamin B12 supplementation.  Follow-up Kevin [name] needs to schedule follow-up appointments to monitor his progress and adjust treatment as necessary. The clinic schedule has been reduced, affecting appointment availability. - Schedule follow-up appointment on April 15th at 4:30 PM. - Schedule a morning appointment for fasting labs to assess A1c, insulin levels, and lipid profile.  Vitals Temp: (!) 97.5 F (36.4 C) BP: 110/70 Pulse Rate: 76 SpO2: 96 %   Anthropometric Measurements Height: 6\' 1"  (1.854 m) Weight: (!) 331 lb (150.1 kg) BMI (Calculated): 43.68 Weight at Last Visit: 341lb Weight Lost Since Last Visit: 10lb Weight Gained Since Last  Visit: 0 Starting Weight: 349lb Total Weight Loss (lbs): 18 lb (8.165 kg)   Body Composition  Body Fat %: 38.8 % Fat Mass (lbs): 128.8 lbs Muscle Mass (lbs): 193.2 lbs Total Body Water (lbs): 144.2 lbs Visceral Fat Rating : 25   Other Clinical Data Fasting: no Labs: no Today's Visit #: 30 Starting Date: 01/21/21     ASSESSMENT AND PLAN:  Diet: Ezell is currently in the action stage of change. As such, his goal is to continue with weight loss efforts. He has agreed to Category 4 Plan.  Exercise: Raevon has been instructed to work up to a goal of 150 minutes of combined cardio and strengthening exercise per week for weight loss and overall health benefits.   Behavior Modification:  We discussed the following Behavioral Modification Strategies today: increasing lean protein intake, decreasing simple carbohydrates, increasing vegetables, increase H2O intake, increase high fiber foods, meal planning and cooking strategies, avoiding temptations, and planning for success. We discussed various medication options to help Debbie with his weight loss efforts and we both agreed to continue Zepbound 5 mg weekly for primary indication of OSA and continue to work on nutritional and behavioral strategies to promote weight loss.  Marland Kitchen  Return in about 4 weeks (around 08/18/2023).Marland Kitchen He was informed of the importance of frequent follow up visits to maximize his success with intensive lifestyle modifications for his multiple health conditions.  Attestation Statements:   Reviewed by clinician on day of visit: allergies, medications, problem list, medical history, surgical history, family history, social history, and previous encounter notes.   Time spent on visit including pre-visit chart review and post-visit care and charting was 26 minutes.    Marquetta Weiskopf, PA-C

## 2023-07-26 LAB — GENECONNECT MOLECULAR SCREEN: Genetic Analysis Overall Interpretation: NEGATIVE

## 2023-08-15 NOTE — Progress Notes (Unsigned)
 SUBJECTIVE: Discussed the use of AI scribe software for clinical note transcription with the patient, who gave verbal consent to proceed.  Chief Complaint: Obesity  Interim History: He is down 3 lbs since last visit.  Down 21 lbs overall TBW Loss of 6.02 %  Kevin [name] is here to discuss his progress with his obesity treatment plan. He is on the Category 4 Plan and states he is following his eating plan approximately 70 % of the time. He states he is exercising gym and walking 30-60 minutes 2 times per week.  Kevin [name] "Kevin [name]" is a 53 year old male with obesity and obstructive sleep apnea who presents for follow-up of his obesity treatment plan.  He was recently started on Zepbound for obstructive sleep apnea and has responded well, losing a total of 13 pounds since starting the medication in late January and 21 lbs overall. Most of this weight loss has occurred in recent months following the initiation of Zepbound therapy. He tolerates Zepbound well, with no major issues such as stomach upset, nausea, vomiting, diarrhea, or constipation. He experienced some queasiness initially when switching to a higher dose, but this has stabilized. He notes a slight return of hunger, particularly at night, and manages this by consuming small portions of popcorn. He does feel like he is ready to increase the dose of Zepbound.   He has a history of prediabetes, vitamin D deficiency, low vitamin B12, and hyperlipidemia. He is currently taking metformin and vitamin D supplements without any issues. He obtains vitamin B12 over the counter.  He maintains a healthy diet, focusing on protein and vegetables, and avoids overindulging in high-calorie foods. He drinks water throughout the day and occasionally consumes sugar-free carbonated drinks. He has not craved desserts since starting the program. His social support includes his mother, who adapts meals to accommodate his dietary needs. He has started a new job  position, which allows him more control over his meal times, aiding in his weight management. He also engages in physical activity, walking three miles recently, and has started going to the gym with his brother-in-law.  His foot has completely healed, and back pain has resolved after several weeks.  He is actively participating in a weight loss program at work, which includes speaking about his healthy weight loss journey.  OBJECTIVE: Visit Diagnoses: Problem List Items Addressed This Visit     Prediabetes - Primary   Relevant Medications   metFORMIN (GLUCOPHAGE) 500 MG tablet   Vitamin D deficiency   Relevant Medications   Vitamin D, Ergocalciferol, (DRISDOL) 1.25 MG (50000 UNIT) CAPS capsule   Low serum vitamin B12   Obesity (HCC)- start BMI-46.04   Relevant Medications   metFORMIN (GLUCOPHAGE) 500 MG tablet   tirzepatide (ZEPBOUND) 7.5 MG/0.5ML Pen   Other hyperlipidemia   OSA (obstructive sleep apnea)   Relevant Medications   tirzepatide (ZEPBOUND) 7.5 MG/0.5ML Pen  Obesity Kevin [name] has been on Zepbound for obstructive sleep apnea and obesity management, resulting in a weight loss of 13 pounds since initiation in late January and 21 lbs overall.  He reports a slight return of increased hunger at times with current dose. He is maintaining muscle mass while losing adipose tissue, indicating healthy weight loss. He is considering increasing the dose to manage hunger better. The decision to increase the dose is supported by the fact that he is on a low dose and has room to increase to manage hunger and cravings effectively. - Increase Zepbound dose to  7.5 mg - Monitor for increased appetite suppression and potential nausea - Encourage regular protein intake and hydration - Advise on avoiding fatty foods to reduce nausea - Continue monitoring weight and muscle mass  Obstructive Sleep Apnea Kevin [name] was started on Zepbound for obstructive sleep apnea, which has contributed to weight  loss. The weight loss is expected to improve sleep apnea symptoms. Plan; Intensive lifestyle modifications are the first line treatment for this issue. We discussed several lifestyle modifications today and he will continue to work on diet, exercise and weight loss efforts. We will continue to monitor. Orders and follow up as documented in patient record. Educated pt that OSA is a cause of systemic hypertension and is associated with an increased incidence of stroke, heart failure, atrial fibrillation, and coronary heart disease.  Severe OSA increases all-cause mortality and cardiovascular mortality.  -Goal: Treatment of OSA via CPAP compliance and weight loss. Plasma ghrelin levels (appetite or "hunger hormone") are significantly higher in OSA patients than in BMI-matched controls, but decrease to levels similar to those of obese patients without OSA after CPAP treatment.  Weight loss improves OSA by several mechanisms, including reduction in fatty tissue in the throat (i.e. parapharyngeal fat) and the tongue. Loss of abdominal fat increases mediastinal traction on the upper airway making it less likely to collapse during sleep. Studies have also shown that compliance with CPAP treatment improves leptin imbalance. Continue Zepbound and increase to 7.5 mg weekly.   Meds ordered this encounter  Medications   metFORMIN (GLUCOPHAGE) 500 MG tablet    Sig: Take 1 tablet (500 mg total) by mouth 2 (two) times daily with a meal.    Dispense:  180 tablet    Refill:  0   Vitamin D, Ergocalciferol, (DRISDOL) 1.25 MG (50000 UNIT) CAPS capsule    Sig: Take 1 capsule (50,000 Units total) by mouth every 7 (seven) days.    Dispense:  12 capsule    Refill:  0   tirzepatide (ZEPBOUND) 7.5 MG/0.5ML Pen    Sig: Inject 7.5 mg into the skin once a week.    Dispense:  2 mL    Refill:  0     Prediabetes Kevin [name] is currently on metformin. The weight loss from Zepbound is beneficial for managing prediabetes. -  Continue metformin and metformin Continue working on nutrition plan to decrease simple carbohydrates, increase lean proteins and exercise to promote weight loss, improve glycemic control and prevent progression to Type 2 diabetes.  Meds ordered this encounter  Medications   metFORMIN (GLUCOPHAGE) 500 MG tablet    Sig: Take 1 tablet (500 mg total) by mouth 2 (two) times daily with a meal.    Dispense:  180 tablet    Refill:  0   Vitamin D, Ergocalciferol, (DRISDOL) 1.25 MG (50000 UNIT) CAPS capsule    Sig: Take 1 capsule (50,000 Units total) by mouth every 7 (seven) days.    Dispense:  12 capsule    Refill:  0   tirzepatide (ZEPBOUND) 7.5 MG/0.5ML Pen    Sig: Inject 7.5 mg into the skin once a week.    Dispense:  2 mL    Refill:  0     Hyperlipidemia Kevin [name] has hyperlipidemia. The weight loss from Zepbound is expected to have a positive effect on lipid levels. Fasting labs will be performed at the next visit to assess lipid levels. - Perform fasting labs at next visit to assess lipid levels  Vitamin D Deficiency Kevin [name] is taking Ergocalciferol 50,000  units weekly. Last vitamin D Lab Results  Component Value Date   VD25OH 37.8 03/09/2023   Continue /refill ergocalciferol 50,000 units weekly Low vitamin D levels can be associated with adiposity and may result in leptin resistance and weight gain. Also associated with fatigue.  Currently on vitamin D supplementation without any adverse effects such as nausea, vomiting or muscle weakness.  Recheck vitamin D levels next visit.   Vitamin B12 Deficiency Kevin [name] is taking over-the-counter vitamin B12 supplements as insurance does not cover the prescription.  General Health Maintenance Kevin [name] is actively engaged in a weight loss program and is incorporating healthy lifestyle changes, including regular exercise and mindful eating. He is also involved in a workplace wellness initiative and is preparing to speak on healthy weight loss and activity.  He has been provided with educational materials to support his presentation. - Encourage continued physical activity and healthy eating habits - Provide educational materials on healthy weight loss and nutrition  Follow-up Kevin [name] is scheduled for follow-up appointments to monitor progress and adjust treatment as necessary. - Schedule follow-up appointment on May 13th for fasting labs - Schedule follow-up appointment on June 9th at 4 PM  Vitals Temp: 97.8 F (36.6 C) BP: 115/68 Pulse Rate: 75 SpO2: 97 %   Anthropometric Measurements Height: 6\' 1"  (1.854 m) Weight: (!) 328 lb (148.8 kg) BMI (Calculated): 43.28 Weight at Last Visit: 331 lb Weight Lost Since Last Visit: 3 lb Weight Gained Since Last Visit: 0 Starting Weight: 349 lb Total Weight Loss (lbs): 21 lb (9.526 kg)   Body Composition  Body Fat %: 38.4 % Fat Mass (lbs): 126.2 lbs Muscle Mass (lbs): 192.8 lbs Total Body Water (lbs): 143.2 lbs Visceral Fat Rating : 24   Other Clinical Data Fasting: no Labs: no Today's Visit #: 31 Starting Date: 01/21/21     ASSESSMENT AND PLAN:  Diet: Kevin [name] is currently in the action stage of change. As such, his goal is to continue with weight loss efforts. He has agreed to Category 4 Plan.  Exercise: Kevin [name] has been instructed to work up to a goal of 150 minutes of combined cardio and strengthening exercise per week for weight loss and overall health benefits.   Behavior Modification:  We discussed the following Behavioral Modification Strategies today: increasing lean protein intake, decreasing simple carbohydrates, increasing vegetables, increase H2O intake, increase high fiber foods, no skipping meals, avoiding temptations, and planning for success. We discussed various medication options to help Kevin [name] with his weight loss efforts and we both agreed to increase Zepbound to 7.5 mg weekly and continue to work on nutritional and behavioral strategies to promote weight  loss.  .  Return in about 4 weeks (around 09/13/2023) for Fasting Lab.Kevin [name] He was informed of the importance of frequent follow up visits to maximize his success with intensive lifestyle modifications for his multiple health conditions.  Attestation Statements:   Reviewed by clinician on day of visit: allergies, medications, problem list, medical history, surgical history, family history, social history, and previous encounter notes.   Time spent on visit including pre-visit chart review and post-visit care and charting was 35 minutes.    Kevin Rovner, PA-C

## 2023-08-16 ENCOUNTER — Ambulatory Visit (INDEPENDENT_AMBULATORY_CARE_PROVIDER_SITE_OTHER): Admitting: Physician Assistant

## 2023-08-16 ENCOUNTER — Encounter (INDEPENDENT_AMBULATORY_CARE_PROVIDER_SITE_OTHER): Payer: Self-pay | Admitting: Physician Assistant

## 2023-08-16 VITALS — BP 115/68 | HR 75 | Temp 97.8°F | Ht 73.0 in | Wt 328.0 lb

## 2023-08-16 DIAGNOSIS — E559 Vitamin D deficiency, unspecified: Secondary | ICD-10-CM | POA: Diagnosis not present

## 2023-08-16 DIAGNOSIS — G4733 Obstructive sleep apnea (adult) (pediatric): Secondary | ICD-10-CM | POA: Diagnosis not present

## 2023-08-16 DIAGNOSIS — E538 Deficiency of other specified B group vitamins: Secondary | ICD-10-CM

## 2023-08-16 DIAGNOSIS — R7303 Prediabetes: Secondary | ICD-10-CM | POA: Diagnosis not present

## 2023-08-16 DIAGNOSIS — E7849 Other hyperlipidemia: Secondary | ICD-10-CM | POA: Diagnosis not present

## 2023-08-16 DIAGNOSIS — Z6841 Body Mass Index (BMI) 40.0 and over, adult: Secondary | ICD-10-CM

## 2023-08-16 MED ORDER — VITAMIN D (ERGOCALCIFEROL) 1.25 MG (50000 UNIT) PO CAPS: 50000.0000 [IU] | ORAL_CAPSULE | ORAL | 0 refills | Status: DC

## 2023-08-16 MED ORDER — METFORMIN HCL 500 MG PO TABS
500.0000 mg | ORAL_TABLET | Freq: Two times a day (BID) | ORAL | 0 refills | Status: AC
Start: 2023-08-16 — End: ?

## 2023-08-16 MED ORDER — ZEPBOUND 7.5 MG/0.5ML ~~LOC~~ SOAJ: 7.5000 mg | SUBCUTANEOUS | 0 refills | Status: DC

## 2023-09-12 NOTE — Progress Notes (Unsigned)
 SUBJECTIVE: Discussed the use of AI scribe software Kevin [name] [name] clinical note transcription with the patient, who gave verbal consent to proceed.  Chief Complaint: Obesity  Interim History: Kevin [name] is down 6 lbs since last visit.  Down 27 lbs overall TBW loss of 5.7% Kevin [name] is here to discuss his progress with his obesity treatment plan. Kevin [name] is on the Category 4 Plan and states Kevin [name] is following his eating plan approximately 70 % of the time. Kevin [name] states Kevin [name] is exercising 60 minutes 3 times per week.  Kevin [name] Kevin [name] [name] "Kevin [name] Kevin [name] [name]" is a 53 year old male who presents Kevin [name] [name] follow-up of his obesity treatment plan.  Kevin [name] has been on a treatment plan Kevin [name] [name] obesity, taking 7.5 mg of Zepbound  weekly Kevin [name] [name] the past month, resulting in a weight loss of six pounds. Kevin [name] notes increased energy levels and improved activity tolerance, now comfortably completing an hour of workouts and increasing his daily steps from 4,000 to 6,000. Kevin [name] recently completed a 4.5-mile hike with faster recovery than usual.  Kevin [name] and his wife have been meal planning and prepping, facing challenges with upcoming events like Mother's Day and his wife's upcoming birthday. Kevin [name] experiences a decrease in cravings, particularly Kevin [name] [name] sweets, and has been able to pass on desserts. His appetite is suppressed in the mornings, often not feeling hungry until late morning, but Kevin [name] is not skipping and consumes [employer] yogurt with 20 grams of protein in the mornings to manage hunger until lunchtime the afternoon.  Kevin [name] continues to take over-the-counter B12 500 mcg daily supplements and is on metformin  500 mg once a day and Lipitor 20 mg daily without any issues. Kevin [name] has not experienced significant side effects from Zepbound , such as nausea, vomiting, diarrhea, or constipation, although Kevin [name] occasionally goes a day without a bowel movement. Kevin [name] is mindful of his water intake, using a flavored water system to encourage hydration.  His social history includes meal planning with his wife, who  has different vegetable preferences, and Kevin [name] is working to find a balance. Kevin [name] has been incorporating more beans and green vegetables into his diet to meet his protein needs and increase fiber intake.  Fasting labs were obtained today. The patient was informed we would discuss the lab results at the next visit unless there is a critical issue that needs to be addressed sooner. The patient agreed to keep the next visit at the agreed upon time to discuss these results.    Pharmacotherapy: metformin  Kevin [name] [name] prediabetes/insulin  resistance-started 09/24/21    Zepbound  started ~ 05/23/23    On 7.5 mg weekly since ` 08/16/23 OBJECTIVE: Visit Diagnoses: Problem List Items Addressed This Visit     Prediabetes   Relevant Orders   CMP14+EGFR   Hemoglobin A1c   Insulin , random   Vitamin D  deficiency   Relevant Orders   VITAMIN D  25 Hydroxy (Vit-D Deficiency, Fractures)   Low serum vitamin B12   Relevant Orders   Vitamin B12   CBC with Differential/Platelet   Obesity (HCC)- start BMI-46.04   Relevant Medications   tirzepatide  (ZEPBOUND ) 7.5 MG/0.5ML Pen   Other hyperlipidemia   Relevant Orders   Lipid Panel With LDL/HDL Ratio   OSA (obstructive sleep apnea) - Primary   Relevant Medications   tirzepatide  (ZEPBOUND ) 7.5 MG/0.5ML Pen   Other Visit Diagnoses       BMI 40.0-44.9, adult (HCC) Current BMI 42.6       Relevant Medications   tirzepatide  (ZEPBOUND ) 7.5 MG/0.5ML Pen      Obesity Kevin [name] has  been on Zepbound  7.5 mg weekly, resulting in a 5.7% reduction in body weight with an additional 6-pound loss over the past 4 weeks with good maintenance of muscle mass.  Kevin [name] reports increased energy, improved exercise tolerance, and controlled appetite with reduced cravings Kevin [name] [name] sweets.  The current dose is effective and well-tolerated without significant side effects. Denies mass in neck, dysphagia, dyspepsia, persistent hoarseness, abdominal pain, or N/V/Constipation or diarrhea. Has annual eye exam. Mood  is stable.  At around 7% weight loss, further metabolic improvements are anticipated, potentially enhancing his obstructive sleep apnea. - Continue Zepbound  7.5 mg weekly. - Encourage continued meal planning and exercise. - Discuss potential meal service options Kevin [name] [name] convenience. - Reinforce the importance of hydration and fiber intake.  Obstructive Sleep Apnea Weight loss is expected to improve symptoms. Increased energy levels may indicate improved sleep quality. Notable improvements are anticipated with a 7-10% weight loss. - Continue weight loss efforts to further improve symptoms. Continue Zepbound  7.5 mg weekly. Had not experienced meaningful weight loss until starting Zepbound .  Meds ordered this encounter  Medications   tirzepatide  (ZEPBOUND ) 7.5 MG/0.5ML Pen    Sig: Inject 7.5 mg into the skin once a week.    Dispense:  2 mL    Refill:  0    Prediabetes Kevin [name] is on metformin  500 mg once daily and Zepbound  7.5 mg weekly. Weight loss and dietary changes are expected to enhance insulin  sensitivity and glucose control. Kevin [name] tolerates the current regimen well. - Continue metformin  500 mg once daily. No refill needed this visit.  - Order HbA1c and insulin  level to assess glucose control and insulin  sensitivity.  Hyperlipidemia Kevin [name] is on Lipitor 20 mg daily without issues. Weight loss and dietary changes are expected to positively impact lipid levels. - Continue Lipitor as prescribed. - Order lipid panel to assess current levels.  Vitamin D  Deficiency Kevin [name] is taking vitamin D  supplements- Ergocalciferol  50,000 units weekly without issues. A follow-up vitamin D  level is needed to assess current status. - Order vitamin D  level. Low vitamin D  levels can be associated with adiposity and may result in leptin resistance and weight gain. Also associated with fatigue.  Currently on vitamin D  supplementation without any adverse effects such as nausea, vomiting or muscle weakness.   Vitamin B12  Deficiency Kevin [name] is taking over-the-counter B12 supplements 500 mcg daily without issues. A follow-up B12 level is needed to assess current status. - Order B12 level. Vitals Temp: 98.2 F (36.8 C) BP: 112/73 Pulse Rate: 81 SpO2: 95 %   Anthropometric Measurements Height: 6\' 1"  (1.854 m) Weight: (!) 322 lb (146.1 kg) BMI (Calculated): 42.49 Weight at Last Visit: 328 lb Weight Lost Since Last Visit: 6 lb Weight Gained Since Last Visit: 0 Starting Weight: 349 lb Total Weight Loss (lbs): 27 lb (12.2 kg)   Body Composition  Body Fat %: 37.4 % Fat Mass (lbs): 120.8 lbs Muscle Mass (lbs): 192.2 lbs Total Body Water (lbs): 137.4 lbs Visceral Fat Rating : 24   Other Clinical Data Fasting: yes Labs: yes Today's Visit #: 32 Starting Date: 01/21/21     ASSESSMENT AND PLAN:  Diet: Westin is currently in the action stage of change. As such, his goal is to continue with weight loss efforts. Kevin [name] has agreed to Category 4 Plan.  Exercise: Mayank has been instructed to work up to a goal of 150 minutes of combined cardio and strengthening exercise per week Kevin [name] [name] weight loss and overall health benefits.   Behavior Modification:  We discussed the following Behavioral Modification Strategies today: increasing lean protein intake, decreasing simple carbohydrates, increasing vegetables, increase H2O intake, increase high fiber foods, no skipping meals, meal planning and cooking strategies, avoiding temptations, and planning Kevin [name] [name] success. We discussed various medication options to help Bless with his weight loss efforts and we both agreed to continue Zepbound  7.5 mg weekly .  Return in about 4 weeks (around 10/11/2023).Aaron Aas Kevin [name] was informed of the importance of frequent follow up visits to maximize his success with intensive lifestyle modifications Kevin [name] [name] his multiple health conditions.  Attestation Statements:   Reviewed by clinician on day of visit: allergies, medications, problem list, medical  history, surgical history, family history, social history, and previous encounter notes.   Time spent on visit including pre-visit chart review and post-visit care and charting was 33 minutes.    Ferron Ishmael, PA-C

## 2023-09-13 ENCOUNTER — Encounter (INDEPENDENT_AMBULATORY_CARE_PROVIDER_SITE_OTHER): Payer: Self-pay | Admitting: Physician Assistant

## 2023-09-13 ENCOUNTER — Ambulatory Visit (INDEPENDENT_AMBULATORY_CARE_PROVIDER_SITE_OTHER): Admitting: Physician Assistant

## 2023-09-13 VITALS — BP 112/73 | HR 81 | Temp 98.2°F | Ht 73.0 in | Wt 322.0 lb

## 2023-09-13 DIAGNOSIS — R7303 Prediabetes: Secondary | ICD-10-CM

## 2023-09-13 DIAGNOSIS — E7849 Other hyperlipidemia: Secondary | ICD-10-CM | POA: Diagnosis not present

## 2023-09-13 DIAGNOSIS — G4733 Obstructive sleep apnea (adult) (pediatric): Secondary | ICD-10-CM | POA: Diagnosis not present

## 2023-09-13 DIAGNOSIS — Z6841 Body Mass Index (BMI) 40.0 and over, adult: Secondary | ICD-10-CM

## 2023-09-13 DIAGNOSIS — E538 Deficiency of other specified B group vitamins: Secondary | ICD-10-CM | POA: Diagnosis not present

## 2023-09-13 DIAGNOSIS — E559 Vitamin D deficiency, unspecified: Secondary | ICD-10-CM | POA: Diagnosis not present

## 2023-09-13 MED ORDER — ZEPBOUND 7.5 MG/0.5ML ~~LOC~~ SOAJ: 7.5000 mg | SUBCUTANEOUS | 0 refills | Status: DC

## 2023-09-14 LAB — CBC WITH DIFFERENTIAL/PLATELET
Basophils Absolute: 0.1 10*3/uL (ref 0.0–0.2)
Basos: 1 %
EOS (ABSOLUTE): 0.2 10*3/uL (ref 0.0–0.4)
Eos: 3 %
Hematocrit: 45.7 % (ref 37.5–51.0)
Hemoglobin: 15 g/dL (ref 13.0–17.7)
Immature Grans (Abs): 0 10*3/uL (ref 0.0–0.1)
Immature Granulocytes: 0 %
Lymphocytes Absolute: 2 10*3/uL (ref 0.7–3.1)
Lymphs: 30 %
MCH: 30.2 pg (ref 26.6–33.0)
MCHC: 32.8 g/dL (ref 31.5–35.7)
MCV: 92 fL (ref 79–97)
Monocytes Absolute: 0.8 10*3/uL (ref 0.1–0.9)
Monocytes: 12 %
Neutrophils Absolute: 3.6 10*3/uL (ref 1.4–7.0)
Neutrophils: 54 %
Platelets: 302 10*3/uL (ref 150–450)
RBC: 4.97 x10E6/uL (ref 4.14–5.80)
RDW: 13.1 % (ref 11.6–15.4)
WBC: 6.7 10*3/uL (ref 3.4–10.8)

## 2023-09-14 LAB — CMP14+EGFR
ALT: 48 IU/L — ABNORMAL HIGH (ref 0–44)
AST: 25 IU/L (ref 0–40)
Albumin: 4.7 g/dL (ref 3.8–4.9)
Alkaline Phosphatase: 116 IU/L (ref 44–121)
BUN/Creatinine Ratio: 13 (ref 9–20)
BUN: 13 mg/dL (ref 6–24)
Bilirubin Total: 0.7 mg/dL (ref 0.0–1.2)
CO2: 21 mmol/L (ref 20–29)
Calcium: 9.6 mg/dL (ref 8.7–10.2)
Chloride: 100 mmol/L (ref 96–106)
Creatinine, Ser: 0.99 mg/dL (ref 0.76–1.27)
Globulin, Total: 2.5 g/dL (ref 1.5–4.5)
Glucose: 100 mg/dL — ABNORMAL HIGH (ref 70–99)
Potassium: 4.1 mmol/L (ref 3.5–5.2)
Sodium: 138 mmol/L (ref 134–144)
Total Protein: 7.2 g/dL (ref 6.0–8.5)
eGFR: 92 mL/min/{1.73_m2} (ref >59)

## 2023-09-14 LAB — LIPID PANEL WITH LDL/HDL RATIO
Cholesterol, Total: 145 mg/dL (ref 100–199)
HDL: 44 mg/dL (ref >39)
LDL Chol Calc (NIH): 72 mg/dL (ref 0–99)
LDL/HDL Ratio: 1.6 ratio (ref 0.0–3.6)
Triglycerides: 170 mg/dL — ABNORMAL HIGH (ref 0–149)
VLDL Cholesterol Cal: 29 mg/dL (ref 5–40)

## 2023-09-14 LAB — HEMOGLOBIN A1C
Est. average glucose Bld gHb Est-mCnc: 114 mg/dL
Hgb A1c MFr Bld: 5.6 % (ref 4.8–5.6)

## 2023-09-14 LAB — VITAMIN B12: Vitamin B-12: 450 pg/mL (ref 232–1245)

## 2023-09-14 LAB — VITAMIN D 25 HYDROXY (VIT D DEFICIENCY, FRACTURES): Vit D, 25-Hydroxy: 50 ng/mL (ref 30.0–100.0)

## 2023-09-14 LAB — INSULIN, RANDOM: INSULIN: 27.1 u[IU]/mL — ABNORMAL HIGH (ref 2.6–24.9)

## 2023-10-09 NOTE — Progress Notes (Unsigned)
 SUBJECTIVE: Discussed the use of AI scribe software for clinical note transcription with the patient, who gave verbal consent to proceed.  Chief Complaint: Obesity  Interim History: He is down 6 lbs since last visit.  Down 27 lbs overall TBW loss of 5.7% Kevin [name] is here to discuss his progress with his obesity treatment plan. He is on the Category 4 Plan and states he is following his eating plan approximately 70 % of the time. He states he is exercising 60 minutes 3 times per week.  Kevin [name] "Kevin [name]" is a 53 year old male who presents for follow-up of his obesity treatment plan.  He has been on a treatment plan for obesity, taking 7.5 mg of Zepbound  weekly for the past month, resulting in a weight loss of six pounds. He notes increased energy levels and improved activity tolerance, now comfortably completing an hour of workouts and increasing his daily steps from 4,000 to 6,000. He recently completed a 4.5-mile hike with faster recovery than usual.  He and his wife have been meal planning and prepping, facing challenges with upcoming events like Mother's Day and his wife's upcoming birthday. He experiences a decrease in cravings, particularly for sweets, and has been able to pass on desserts. His appetite is suppressed in the mornings, often not feeling hungry until late morning, but he is not skipping and consumes Ch[employer]ani yogurt with 20 grams of protein in the mornings to manage hunger until lunchtime the afternoon.  He continues to take over-the-counter B12 500 mcg daily supplements and is on metformin  500 mg once a day and Lipitor 20 mg daily without any issues. He has not experienced significant side effects from Zepbound , such as nausea, vomiting, diarrhea, or constipation, although he occasionally goes a day without a bowel movement. He is mindful of his water intake, using a flavored water system to encourage hydration.  His social history includes meal planning with his wife, who  has different vegetable preferences, and he is working to find a balance. He has been incorporating more beans and green vegetables into his diet to meet his protein needs and increase fiber intake.  Fasting labs were obtained today. The patient was informed we would discuss the lab results at the next visit unless there is a critical issue that needs to be addressed sooner. The patient agreed to keep the next visit at the agreed upon time to discuss these results.    Pharmacotherapy: metformin  for prediabetes/insulin  resistance-started 09/24/21    Zepbound  started ~ 05/23/23    On 7.5 mg weekly since ` 08/16/23 OBJECTIVE: Visit Diagnoses: Problem List Items Addressed This Visit     Prediabetes   Relevant Orders   CMP14+EGFR   Hemoglobin A1c   Insulin , random   Vitamin D  deficiency   Relevant Orders   VITAMIN D  25 Hydroxy (Vit-D Deficiency, Fractures)   Low serum vitamin B12   Relevant Orders   Vitamin B12   CBC with Differential/Platelet   Obesity (HCC)- start BMI-46.04   Relevant Medications   tirzepatide  (ZEPBOUND ) 7.5 MG/0.5ML Pen   Other hyperlipidemia   Relevant Orders   Lipid Panel With LDL/HDL Ratio   OSA (obstructive sleep apnea) - Primary   Relevant Medications   tirzepatide  (ZEPBOUND ) 7.5 MG/0.5ML Pen   Other Visit Diagnoses       BMI 40.0-44.9, adult (HCC) Current BMI 42.6       Relevant Medications   tirzepatide  (ZEPBOUND ) 7.5 MG/0.5ML Pen      Obesity He has  been on Zepbound  7.5 mg weekly, resulting in a 5.7% reduction in body weight with an additional 6-pound loss over the past 4 weeks with good maintenance of muscle mass.  He reports increased energy, improved exercise tolerance, and controlled appetite with reduced cravings for sweets.  The current dose is effective and well-tolerated without significant side effects. Denies mass in neck, dysphagia, dyspepsia, persistent hoarseness, abdominal pain, or N/V/Constipation or diarrhea. Has annual eye exam. Mood  is stable.  At around 7% weight loss, further metabolic improvements are anticipated, potentially enhancing his obstructive sleep apnea. - Continue Zepbound  7.5 mg weekly. - Encourage continued meal planning and exercise. - Discuss potential meal service options for convenience. - Reinforce the importance of hydration and fiber intake.  Obstructive Sleep Apnea Weight loss is expected to improve symptoms. Increased energy levels may indicate improved sleep quality. Notable improvements are anticipated with a 7-10% weight loss. - Continue weight loss efforts to further improve symptoms. Continue Zepbound  7.5 mg weekly. Had not experienced meaningful weight loss until starting Zepbound .  Meds ordered this encounter  Medications   tirzepatide  (ZEPBOUND ) 7.5 MG/0.5ML Pen    Sig: Inject 7.5 mg into the skin once a week.    Dispense:  2 mL    Refill:  0    Prediabetes He is on metformin  500 mg once daily and Zepbound  7.5 mg weekly. Weight loss and dietary changes are expected to enhance insulin  sensitivity and glucose control. He tolerates the current regimen well. - Continue metformin  500 mg once daily. No refill needed this visit.  - Order HbA1c and insulin  level to assess glucose control and insulin  sensitivity.  Hyperlipidemia He is on Lipitor 20 mg daily without issues. Weight loss and dietary changes are expected to positively impact lipid levels. - Continue Lipitor as prescribed. - Order lipid panel to assess current levels.  Vitamin D  Deficiency He is taking vitamin D  supplements- Ergocalciferol  50,000 units weekly without issues. A follow-up vitamin D  level is needed to assess current status. - Order vitamin D  level. Low vitamin D  levels can be associated with adiposity and may result in leptin resistance and weight gain. Also associated with fatigue.  Currently on vitamin D  supplementation without any adverse effects such as nausea, vomiting or muscle weakness.   Vitamin B12  Deficiency He is taking over-the-counter B12 supplements 500 mcg daily without issues. A follow-up B12 level is needed to assess current status. - Order B12 level. Vitals Temp: 98.2 F (36.8 C) BP: 112/73 Pulse Rate: 81 SpO2: 95 %   Anthropometric Measurements Height: 6\' 1"  (1.854 m) Weight: (!) 322 lb (146.1 kg) BMI (Calculated): 42.49 Weight at Last Visit: 328 lb Weight Lost Since Last Visit: 6 lb Weight Gained Since Last Visit: 0 Starting Weight: 349 lb Total Weight Loss (lbs): 27 lb (12.2 kg)   Body Composition  Body Fat %: 37.4 % Fat Mass (lbs): 120.8 lbs Muscle Mass (lbs): 192.2 lbs Total Body Water (lbs): 137.4 lbs Visceral Fat Rating : 24   Other Clinical Data Fasting: yes Labs: yes Today's Visit #: 32 Starting Date: 01/21/21     ASSESSMENT AND PLAN:  Diet: Kevin [name] is currently in the action stage of change. As such, his goal is to continue with weight loss efforts. He has agreed to Category 4 Plan.  Exercise: Kevin [name] has been instructed to work up to a goal of 150 minutes of combined cardio and strengthening exercise per week for weight loss and overall health benefits.   Behavior Modification:  We discussed the following Behavioral Modification Strategies today: increasing lean protein intake, decreasing simple carbohydrates, increasing vegetables, increase H2O intake, increase high fiber foods, no skipping meals, meal planning and cooking strategies, avoiding temptations, and planning for success. We discussed various medication options to help Kevin [name] with his weight loss efforts and we both agreed to continue Zepbound  7.5 mg weekly .  Return in about 4 weeks (around 10/11/2023).Kevin [name] He was informed of the importance of frequent follow up visits to maximize his success with intensive lifestyle modifications for his multiple health conditions.  Attestation Statements:   Reviewed by clinician on day of visit: allergies, medications, problem list, medical  history, surgical history, family history, social history, and previous encounter notes.   Time spent on visit including pre-visit chart review and post-visit care and charting was 33 minutes.    Kevin Ishmael, PA-C

## 2023-10-10 ENCOUNTER — Encounter (INDEPENDENT_AMBULATORY_CARE_PROVIDER_SITE_OTHER): Payer: Self-pay | Admitting: Physician Assistant

## 2023-10-10 ENCOUNTER — Ambulatory Visit (INDEPENDENT_AMBULATORY_CARE_PROVIDER_SITE_OTHER): Admitting: Physician Assistant

## 2023-10-10 VITALS — BP 100/64 | HR 71 | Temp 98.0°F | Ht 73.0 in | Wt 314.0 lb

## 2023-10-10 DIAGNOSIS — G4733 Obstructive sleep apnea (adult) (pediatric): Secondary | ICD-10-CM

## 2023-10-10 DIAGNOSIS — R7303 Prediabetes: Secondary | ICD-10-CM | POA: Diagnosis not present

## 2023-10-10 DIAGNOSIS — E559 Vitamin D deficiency, unspecified: Secondary | ICD-10-CM

## 2023-10-10 DIAGNOSIS — Z6841 Body Mass Index (BMI) 40.0 and over, adult: Secondary | ICD-10-CM

## 2023-10-10 DIAGNOSIS — E538 Deficiency of other specified B group vitamins: Secondary | ICD-10-CM

## 2023-10-10 DIAGNOSIS — E669 Obesity, unspecified: Secondary | ICD-10-CM

## 2023-10-10 DIAGNOSIS — E7849 Other hyperlipidemia: Secondary | ICD-10-CM | POA: Diagnosis not present

## 2023-10-10 MED ORDER — ZEPBOUND 7.5 MG/0.5ML ~~LOC~~ SOAJ: 7.5000 mg | SUBCUTANEOUS | 0 refills | Status: DC

## 2023-10-10 MED ORDER — VITAMIN D (ERGOCALCIFEROL) 1.25 MG (50000 UNIT) PO CAPS: 50000.0000 [IU] | ORAL_CAPSULE | ORAL | 0 refills | Status: AC

## 2023-11-08 NOTE — Progress Notes (Signed)
 SUBJECTIVE: Discussed the use of AI scribe software for clinical note transcription with the patient, who gave verbal consent to proceed.  Chief Complaint: Obesity  Interim History: He is down 4 lbs since his last visit Down 39 lbs overall TBW loss of 11%  Kevin [name] is here to discuss his progress with his obesity treatment plan. He is on the Category 4 Plan and states he is following his eating plan approximately 70 % of the time. He states he is exercising walking 60 minutes 2 times per week. Kevin [name] is a 53 year old male with obesity who presents for follow-up of his obesity treatment plan.  He is being followed for his obesity treatment plan and has been on Zepbound  for obstructive sleep apnea, resulting in a total weight loss of 39 pounds. He maintains muscle mass and has lost nearly four more pounds of adipose tissue. His visceral adipose rating is 22, and his BMI is now 40.9, with a body fat percentage of 36.5%.  He feels 'pretty good' overall but has noticed a slight return of hunger, particularly at night, which he manages by choosing healthy snacks like popcorn. During a recent vacation to [place] , he indulged in foods like ice cream but stayed active by walking at the zoo and an amusement park.  He continues to take metformin  without significant side effects from his current medications, including Zepbound . He experiences occasional constipation after injections but no nausea, diarrhea, or other gastrointestinal symptoms. No changes in vision, mood, or swallowing difficulties are noted.  He is on vitamin B12 gummies and a vitamin D  regimen, taken once a week or every two weeks. He reports no issues with his Lipitor (atorvastatin) for hyperlipidemia and is on a low dose of lisinopril-hydrochlorothiazide  for hypertension, with a recent blood pressure reading of 122/79. No dizziness or lightheadedness.  He is considering future travel plans and has questions about  managing his medication during travel, particularly keeping his injection refrigerated. He has been using a Yeti cooler for travel and is exploring options for a portable insulated cooler for his medication.  OBJECTIVE: Visit Diagnoses: Problem List Items Addressed This Visit     Prediabetes   Vitamin D  deficiency   Obesity (HCC)- start BMI-46.04   Relevant Medications   tirzepatide  (ZEPBOUND ) 7.5 MG/0.5ML Pen   Other hyperlipidemia   OSA (obstructive sleep apnea) - Primary   Relevant Medications   tirzepatide  (ZEPBOUND ) 7.5 MG/0.5ML Pen   Other Visit Diagnoses       BMI 40.0-44.9, adult (HCC) Current BMI 41.0       Relevant Medications   tirzepatide  (ZEPBOUND ) 7.5 MG/0.5ML Pen     Obesity Kevin [name] has achieved a total weight loss of 39 pounds on Zepbound  for obstructive sleep apnea, maintaining muscle mass and reducing adipose tissue. His BMI is 40.9, body fat percentage is 36.5%, and visceral adipose rating is 22, with a goal of 12 or less. He reports increased hunger, particularly at night, possibly due to recent vacation dietary changes. The current Zepbound  dose is 7.5 mg, and he is also on metformin , believed to have a synergistic effect. Weight loss has resulted in an 11.2% reduction in body weight, beneficial for overall health. - Continue Zepbound  7.5 mg - Continue metformin  - Monitor weight and hunger levels - Reassess in one month to consider dose adjustment if hunger persists  Obstructive Sleep Apnea Zepbound  has contributed to significant weight loss and improvement in adipose tissue metrics, effectively managing obstructive sleep apnea. -  Continue Zepbound  7.5 mg Meds ordered this encounter  Medications   tirzepatide  (ZEPBOUND ) 7.5 MG/0.5ML Pen    Sig: Inject 7.5 mg into the skin once a week.    Dispense:  2 mL    Refill:  1    Prediabetes Metformin  is believed to work synergistically with Zepbound , and weight loss is beneficial for managing prediabetes. -  Continue metformin   Hypertension Blood pressure is well-controlled at 122/79 mmHg on lisinopril-hydrochlorothiazide . No symptoms of dizziness or lightheadedness, indicating good tolerance. Weight loss may further improve blood pressure control. The medication provides renal protection. - Continue lisinopril-hydrochlorothiazide  - Monitor for symptoms of hypotension  Hyperlipidemia He is on atorvastatin with no reported issues. Long-term use has likely contributed to stable cholesterol levels. - Continue atorvastatin  Vitamin D  Deficiency He is on vitamin D  supplementation once a week or every two weeks, with a sufficient current supply. - Continue current vitamin D  supplementation regimen  General Health Maintenance He takes vitamin B12 gummies in the morning, supports overall health, engages in regular physical activity, and maintains a healthy diet despite occasional dietary indulgences during travel. Advised to use a portable insulated cooler for medication during travel. - Continue vitamin B12 supplementation - Encourage regular physical activity and healthy diet - Use portable insulated cooler for medication during travel  Follow-up Scheduled for follow-up to reassess weight management and medication efficacy. - Follow up on Wednesday, December 07, 2023, at 4 PM - Schedule subsequent follow-up on January 04, 2024, at 3:30 PM  Vitals Temp: 98.5 F (36.9 C) BP: 122/79 Pulse Rate: 78 SpO2: 97 %   Anthropometric Measurements Height: 6' 1 (1.854 m) Weight: (!) 310 lb (140.6 kg) BMI (Calculated): 40.91 Weight at Last Visit: 314 lb Weight Lost Since Last Visit: 4 lb Weight Gained Since Last Visit: 0 Starting Weight: 349 lb Total Weight Loss (lbs): 39 lb (17.7 kg)   Body Composition  Body Fat %: 36.5 % Fat Mass (lbs): 113.4 lbs Muscle Mass (lbs): 187.6 lbs Total Body Water (lbs): 137.4 lbs Visceral Fat Rating : 22   Other Clinical Data Fasting: No Labs: No Today's  Visit #: 34 Starting Date: 01/21/21     ASSESSMENT AND PLAN:  Diet: Kevin [name] is currently in the action stage of change. As such, his goal is to continue with weight loss efforts. He has agreed to Category 4 Plan.  Exercise: Kevin [name] has been instructed to work up to a goal of 150 minutes of combined cardio and strengthening exercise per week for weight loss and overall health benefits.   Behavior Modification:  We discussed the following Behavioral Modification Strategies today: increasing lean protein intake, decreasing simple carbohydrates, increasing vegetables, increase H2O intake, increase high fiber foods, no skipping meals, meal planning and cooking strategies, avoiding temptations, and planning for success. We discussed various medication options to help Kevin [name] with his weight loss efforts and we both agreed to continue current treatment plan.  No follow-ups on file.SABRA He was informed of the importance of frequent follow up visits to maximize his success with intensive lifestyle modifications for his multiple health conditions.  Attestation Statements:   Reviewed by clinician on day of visit: allergies, medications, problem list, medical history, surgical history, family history, social history, and previous encounter notes.   Time spent on visit including pre-visit chart review and post-visit care and charting was 24 minutes.    Grainne Knights, PA-C

## 2023-11-09 ENCOUNTER — Encounter (INDEPENDENT_AMBULATORY_CARE_PROVIDER_SITE_OTHER): Payer: Self-pay | Admitting: Physician Assistant

## 2023-11-09 ENCOUNTER — Ambulatory Visit (INDEPENDENT_AMBULATORY_CARE_PROVIDER_SITE_OTHER): Admitting: Physician Assistant

## 2023-11-09 ENCOUNTER — Other Ambulatory Visit (INDEPENDENT_AMBULATORY_CARE_PROVIDER_SITE_OTHER): Payer: Self-pay | Admitting: Physician Assistant

## 2023-11-09 VITALS — BP 122/79 | HR 78 | Temp 98.5°F | Ht 73.0 in | Wt 310.0 lb

## 2023-11-09 DIAGNOSIS — E7849 Other hyperlipidemia: Secondary | ICD-10-CM | POA: Diagnosis not present

## 2023-11-09 DIAGNOSIS — R7303 Prediabetes: Secondary | ICD-10-CM | POA: Diagnosis not present

## 2023-11-09 DIAGNOSIS — G4733 Obstructive sleep apnea (adult) (pediatric): Secondary | ICD-10-CM

## 2023-11-09 DIAGNOSIS — E559 Vitamin D deficiency, unspecified: Secondary | ICD-10-CM | POA: Diagnosis not present

## 2023-11-09 DIAGNOSIS — Z6841 Body Mass Index (BMI) 40.0 and over, adult: Secondary | ICD-10-CM

## 2023-11-09 DIAGNOSIS — E669 Obesity, unspecified: Secondary | ICD-10-CM

## 2023-11-09 MED ORDER — ZEPBOUND 7.5 MG/0.5ML ~~LOC~~ SOAJ
7.5000 mg | SUBCUTANEOUS | 1 refills | Status: DC
Start: 2023-11-09 — End: 2023-11-14

## 2023-11-10 ENCOUNTER — Telehealth (INDEPENDENT_AMBULATORY_CARE_PROVIDER_SITE_OTHER): Payer: Self-pay

## 2023-11-10 NOTE — Telephone Encounter (Signed)
 PA for Zepbound  7.5 has been denied. PA is now complete.     Dear Kevin [name]: Request for coverage of Zepbound  (tirzepatide ) has been denied. A request for prescription coverage for Zepbound  (tirzepatide ) was recently submitted on your behalf. After careful consideration and review of the information sent to us , this request was not approved. We understand that this decision may not be what you and your doctor expected. This letter and the enclosed information will explain your options and help you decide what to do next. We reviewed all the supporting information sent to us  and used your plan's guidelines to make our decision. Why your request was denied: Your plan does not cover this drug. For your plan, you may need to try 1 other covered drug. If you have tried 1 other covered drug, another option for you is a tirzepatide  product that has the same active ingredient at the same strength and dosage as the requested drug. We have denied your request. You did not meet one of these options: A) You have tried the primary covered drug and it did not work well for you, or B) Your doctor gives us  a medical reason you cannot take the primary covered drug. We reviewed the information we had. Your request has been denied. Your doctor can send us  any new or missing information for us  to review. The primary covered drug for your plan is [name]. The secondary covered drug for your plan is a tirzepatide  product that has the same active ingredient at the same strength and dosage as the requested drug. Your doctor may need to get approval from your plan for covered drugs. For this drug, you may have to meet other criteria. You can request the drug policy for more details. You can also request other plan documents for your review. We've let your doctor know about this denial, so they will be ready to work with you on your next steps or an alternative treatment plan. [facility] cc: Dr.  Elouise Rayburn

## 2023-11-10 NOTE — Telephone Encounter (Signed)
 They denied his Zepbound , however while reading the denial reason it stated they may cover Tirzepatide .    Your plan does not cover this drug. For your plan, you may need to try 1 other covered drug. If you have tried 1 other covered drug, another option for you is a tirzepatide  product that has the same active ingredient at the same strength and dosage as the requested drug. We have denied your request. You did not meet one of these options: A) You have tried the primary covered drug and it did not work well for you, or B) Your doctor gives us  a medical reason you cannot take the primary covered drug. We reviewed the information we had. Your request has been denied. Your doctor can send us  any new or missing information for us  to review. The primary covered drug for your plan is [facility]. The secondary covered drug for your plan is a tirzepatide  product that has the same active ingredient at the same strength and dosage as the requested drug. Your doctor may need to get approval from your plan for covered drugs. For this drug, you may have to meet other criteria. You can request the drug policy for more details. You can also request other plan documents for your review

## 2023-11-10 NOTE — Telephone Encounter (Signed)
 PA questions for Zepbound 7.5  have been answered and all documentation has been included. Waiting on a determination.

## 2023-11-10 NOTE — Telephone Encounter (Signed)
 PA for Zepbound 7.5 has been submitted, awaiting PA questions.

## 2023-11-11 ENCOUNTER — Encounter (INDEPENDENT_AMBULATORY_CARE_PROVIDER_SITE_OTHER): Payer: Self-pay | Admitting: Physician Assistant

## 2023-11-14 ENCOUNTER — Other Ambulatory Visit (INDEPENDENT_AMBULATORY_CARE_PROVIDER_SITE_OTHER): Payer: Self-pay | Admitting: Physician Assistant

## 2023-11-14 ENCOUNTER — Encounter (INDEPENDENT_AMBULATORY_CARE_PROVIDER_SITE_OTHER): Payer: Self-pay

## 2023-11-14 DIAGNOSIS — E7849 Other hyperlipidemia: Secondary | ICD-10-CM

## 2023-11-14 DIAGNOSIS — E66813 Obesity, class 3: Secondary | ICD-10-CM

## 2023-11-14 DIAGNOSIS — G4733 Obstructive sleep apnea (adult) (pediatric): Secondary | ICD-10-CM

## 2023-11-14 MED ORDER — WEGOVY 1 MG/0.5ML ~~LOC~~ SOAJ: 1.0000 mg | SUBCUTANEOUS | 0 refills | Status: DC

## 2023-11-14 MED ORDER — ONDANSETRON 4 MG PO TBDP: 4.0000 mg | ORAL_TABLET | Freq: Three times a day (TID) | ORAL | 0 refills | Status: DC | PRN

## 2023-12-06 NOTE — Progress Notes (Unsigned)
 SUBJECTIVE: Discussed the use of AI scribe software for clinical note transcription with the patient, who gave verbal consent to proceed.  Chief Complaint: Obesity  Interim History: He is down 5 lbs since last visit Down 44 lbs overall TBW loss of 12.6%  Kevin [name] is here to discuss his progress with his obesity treatment plan. He is on the Category 4 Plan and states he is following his eating plan approximately 75 % of the time. He states he is exercising 60 minutes 2-3 times per week.  Kevin [name] is a 53 year old male who presents for follow-up of his obesity treatment plan.  He is currently on Wegovy  1 mg weekly for obesity management, having switched from Zepbound  due to changes in insurance coverage. Over the past few days, he feels queasy, particularly after eating, and has experienced loose stools. He does wonder if he ate something that may have cuased this episode as this is not a regular pattern.  He denies nausea or vomiting, but sweet foods make him feel 'a little weird.' He has lost a total of 44 pounds and is close to being under 300 pounds.  He has a history of obstructive sleep apnea and uses a CPAP machine, which helps him fall asleep within 10-15 minutes. He sleeps well overall.  He has prediabetes and is taking metformin  once a day. He is taking Zestoretic 10-12.5 mg daily for hypertension and denies any dizziness or lightheadedness.  He has vitamin D  deficiency and is taking Ergocalciferol  50,000 units once a week. He follows a Category 4 nutrition plan and reports that his hunger varies, being more pronounced in the mornings. He does not experience unusual cravings.  He engages in physical activity, including walking and occasional weightlifting at [employer] with his wife and brother-in-law. He has kettlebells and smaller weights at home for exercise and we discussed trying to incorporate some weight training 2 x weekly. Kevin [name]  His mother has been living  with him for two months following surgery, and she is expected to be cleared to return home soon. She uses a cane for longer walks and a back brace for comfort.  Seeing PCP soon for AWV and may have fasting labs done at that time.   OBJECTIVE: Visit Diagnoses: Problem List Items Addressed This Visit     Prediabetes - Primary   Vitamin D  deficiency   Obesity (HCC)- start BMI-46.04   Relevant Medications   semaglutide -weight management (WEGOVY ) 1 MG/0.5ML SOAJ SQ injection   Essential hypertension   OSA (obstructive sleep apnea)   Relevant Medications   semaglutide -weight management (WEGOVY ) 1 MG/0.5ML SOAJ SQ injection   Other Visit Diagnoses       BMI 40.0-44.9, adult (HCC) Current BMI 40.3       Relevant Medications   semaglutide -weight management (WEGOVY ) 1 MG/0.5ML SOAJ SQ injection     Obesity Continued weight loss with current regimen. Transitioned from Zepbound  to Wegovy  due to insurance changes. Experiencing mild gastrointestinal side effects, possibly related to dietary changes or medication adjustment. Weight loss is slow but healthy, with a focus on fat loss rather than muscle loss. Current dose is effective, and a dose increase will be considered if necessary. - Continue Wegovy  at current dose of 1 mg for a few more weeks before considering dose increase. - Encourage strength training exercises at least once or twice a week. - Maintain current dietary regimen with focus on whole foods, adequate protein, and hydration. - Monitor for any significant  side effects or changes in weight loss progress. Meds ordered this encounter  Medications   semaglutide -weight management (WEGOVY ) 1 MG/0.5ML SOAJ SQ injection    Sig: Inject 1 mg into the skin once a week.    Dispense:  2 mL    Refill:  0    Pt. Having to switch to Wegovy  due to insurance coverage    Prediabetes Continues on metformin  once daily in addition to Wegovy . Weight loss and dietary management are key components  of management. No GI side effects with metformin , but may need to stop with switch to Wegovy  with some nausea now and occasional diarrhea.  - Continue metformin  once daily. No refill needed.  Continue working on nutrition plan to decrease simple carbohydrates, increase lean proteins and exercise to promote weight loss, improve glycemic control and prevent progression to Type 2 diabetes.  Seeing PCP next week, likely have labs done, but can do over next 1-2 visits if not completed then.  Hypertension Blood pressure is well-controlled on current medication regimen. No symptoms of dizziness or lightheadedness reported. - Continue current antihypertensive medication regimen.   Obstructive sleep apnea Continues to use CPAP machine effectively, with good sleep quality reported. Transitioned from Zepbound  to Wegovy  due to insurance changes.  Had done very well with Zepbound  and hopeful to have good results with weight loss as well.   Vitamin D  deficiency Vitamin D  levels have improved significantly. Current level is 50, within the target range of 50-70. - Reduce vitamin D  supplementation- Ergocalciferol  50,000 units to once every other week.  Vitals Temp: 98.3 F (36.8 C) BP: 104/67 Pulse Rate: 94 SpO2: 99 %   Anthropometric Measurements Height: 6' 1 (1.854 m) Weight: (!) 305 lb (138.3 kg) BMI (Calculated): 40.25 Weight at Last Visit: 310 lb Weight Lost Since Last Visit: 5 lb Weight Gained Since Last Visit: 0 Starting Weight: 349 lb Total Weight Loss (lbs): 44 lb (20 kg)   Body Composition  Body Fat %: 36.2 % Fat Mass (lbs): 110.4 lbs Muscle Mass (lbs): 185.4 lbs Total Body Water (lbs): 135.42 lbs Visceral Fat Rating : 22   Other Clinical Data Fasting: no Labs: no Today's Visit #: 35 Starting Date: 01/21/21     ASSESSMENT AND PLAN:  Diet: Kevin [name] is currently in the action stage of change. As such, his goal is to continue with weight loss efforts. He has agreed to  Category 4 Plan.  Exercise: Kevin [name] has been instructed to work up to a goal of 150 minutes of combined cardio and strengthening exercise per week for weight loss and overall health benefits.   Behavior Modification:  We discussed the following Behavioral Modification Strategies today: increasing lean protein intake, decreasing simple carbohydrates, increasing vegetables, increase H2O intake, increase high fiber foods, no skipping meals, meal planning and cooking strategies, avoiding temptations, and planning for success. We discussed various medication options to help Kevin [name] with his weight loss efforts and we both agreed to continue current treatment plan.  Return in about 4 weeks (around 01/04/2024).Kevin [name] He was informed of the importance of frequent follow up visits to maximize his success with intensive lifestyle modifications for his multiple health conditions.  Attestation Statements:   Reviewed by clinician on day of visit: allergies, medications, problem list, medical history, surgical history, family history, social history, and previous encounter notes.   Time spent on visit including pre-visit chart review and post-visit care and charting was 32 minutes.    Kyeisha Janowicz, PA-C

## 2023-12-07 ENCOUNTER — Ambulatory Visit (INDEPENDENT_AMBULATORY_CARE_PROVIDER_SITE_OTHER): Admitting: Physician Assistant

## 2023-12-07 ENCOUNTER — Encounter (INDEPENDENT_AMBULATORY_CARE_PROVIDER_SITE_OTHER): Payer: Self-pay | Admitting: Physician Assistant

## 2023-12-07 VITALS — BP 104/67 | HR 94 | Temp 98.3°F | Ht 73.0 in | Wt 305.0 lb

## 2023-12-07 DIAGNOSIS — Z6841 Body Mass Index (BMI) 40.0 and over, adult: Secondary | ICD-10-CM

## 2023-12-07 DIAGNOSIS — E559 Vitamin D deficiency, unspecified: Secondary | ICD-10-CM | POA: Diagnosis not present

## 2023-12-07 DIAGNOSIS — G4733 Obstructive sleep apnea (adult) (pediatric): Secondary | ICD-10-CM

## 2023-12-07 DIAGNOSIS — I10 Essential (primary) hypertension: Secondary | ICD-10-CM

## 2023-12-07 DIAGNOSIS — E538 Deficiency of other specified B group vitamins: Secondary | ICD-10-CM

## 2023-12-07 DIAGNOSIS — E669 Obesity, unspecified: Secondary | ICD-10-CM

## 2023-12-07 DIAGNOSIS — E7849 Other hyperlipidemia: Secondary | ICD-10-CM

## 2023-12-07 DIAGNOSIS — R7303 Prediabetes: Secondary | ICD-10-CM | POA: Diagnosis not present

## 2023-12-07 DIAGNOSIS — K760 Fatty (change of) liver, not elsewhere classified: Secondary | ICD-10-CM

## 2023-12-07 MED ORDER — WEGOVY 1 MG/0.5ML ~~LOC~~ SOAJ: 1.0000 mg | SUBCUTANEOUS | 0 refills | Status: DC

## 2023-12-13 DIAGNOSIS — I10 Essential (primary) hypertension: Secondary | ICD-10-CM | POA: Diagnosis not present

## 2023-12-13 DIAGNOSIS — Z125 Encounter for screening for malignant neoplasm of prostate: Secondary | ICD-10-CM | POA: Diagnosis not present

## 2023-12-13 DIAGNOSIS — Z0000 Encounter for general adult medical examination without abnormal findings: Secondary | ICD-10-CM | POA: Diagnosis not present

## 2023-12-13 DIAGNOSIS — E7800 Pure hypercholesterolemia, unspecified: Secondary | ICD-10-CM | POA: Diagnosis not present

## 2023-12-13 DIAGNOSIS — R7301 Impaired fasting glucose: Secondary | ICD-10-CM | POA: Diagnosis not present

## 2024-01-03 NOTE — Progress Notes (Unsigned)
 SUBJECTIVE: Discussed the use of AI scribe software for clinical note transcription with the patient, who gave verbal consent to proceed.  Chief Complaint: Obesity  Interim History: He is down 5 lbs since his last visit. Down 49 lbs overall TBW loss of 14.0%  Kevin [name] is here to discuss his progress with his obesity treatment plan. He is on the Category 4 Plan and states he is following his eating plan approximately 75 % of the time. He states he is exercising gym/walking 60 minutes 3 times per week. Kevin [name] is a 53 year old male with obesity who presents for follow-up of his obesity treatment plan.  He has switched from Zepbound  to Wegovy  due to insurance issues. Wegovy  has been effective for weight management, although he experiences intermittent nausea, which was not present with Zepbound . He has lost weight, with his current weight at 300 pounds and a BMI under 40.  He is currently on lisinopril for hypertension, with a blood pressure reading of 103/66. He has not experienced significant lightheadedness unless he goes for long periods without eating, recalling an instance of feeling lightheaded after not eating for six hours. He is on lisinopril, 10/12.5 mg, and has previously tried discontinuing it, which resulted in increased blood pressure.  He has a history of fatty liver, confirmed by ultrasound, and has had slightly elevated liver enzymes. He is scheduled for a recheck at the end of the month. He is currently taking cholesterol medicine, and his recent lipid panel showed total cholesterol at 132, HDL at 45, triglycerides at 103, and LDL at 68. His A1c is 5.6, indicating he is no longer in the prediabetic range.  His energy levels have improved, and he no longer feels as tired during the day. He has been incorporating more physical activity, including walking and weightlifting, and has set up a home gym with a treadmill and hand weights. He experiences some hip soreness  after walking, which he attributes to increased activity.  He notes that his dietary changes have influenced his son's eating habits, as he no longer buys processed foods.    Pharmacotherapy:  metformin  500 mg twice daily-prediabetes    Zepbound - OSA- start 1/25- DC 7/25 as insurance no      Longer covering medication.     Wegovy  started 11/14/23    OBJECTIVE: Visit Diagnoses: Problem List Items Addressed This Visit     Prediabetes   Vitamin D  deficiency   Obesity, unspecified   Relevant Medications   semaglutide -weight management (WEGOVY ) 1 MG/0.5ML SOAJ SQ injection   Essential hypertension   Other hyperlipidemia   OSA (obstructive sleep apnea)   Relevant Medications   semaglutide -weight management (WEGOVY ) 1 MG/0.5ML SOAJ SQ injection   Other Visit Diagnoses       Polyphagia    -  Primary     Nausea       Relevant Medications   ondansetron  (ZOFRAN -ODT) 4 MG disintegrating tablet     BMI 39.0-39.9,adult Current BMI 39.7          Obesity with associated prediabetes, hypertension, hyperlipidemia, and fatty liver disease Obesity management with Wegovy  is ongoing, resulting in significant weight loss and improvement in associated conditions. BMI is now under 40, and body adipose percentage is at 35%. Blood pressure is well-controlled at 103/66 mmHg on lisinopril. A1c is 5.6, indicating no longer in the prediabetic range. Lipid panel shows total cholesterol at 132, HDL at 45, triglycerides at 103, and LDL at 68. ALT is slightly  elevated, consistent with known fatty liver disease. Wegovy  is beneficial for weight loss and may improve fatty liver disease. Statin therapy may be reconsidered if lipid levels remain stable without it. - Continue Wegovy  for weight management. - Monitor blood pressure and consider adjusting lisinopril if hypotension symptoms occur. - Re-evaluate statin therapy with primary care if lipid levels remain stable. - Follow up on liver function tests at the end  of the month. - Encourage diet high in protein and fresh vegetables and fruits. - Continue exercise regimen including weight training and incline walking.  Obstructive sleep apnea Previously managed with Zepbound , which is no longer covered by insurance. Current management with Wegovy  is ongoing, which may also aid in weight loss and improve sleep apnea symptoms. - Continue Wegovy  as current management for weight loss and potential improvement in sleep apnea.  Nausea secondary to Wegovy  Intermittent nausea associated with Wegovy  use, more pronounced than with previous Zepbound  use. Nausea is not consistent and can occur with various foods. - Refill Zofran  for nausea management, to be used as needed.  Vitamin D  deficiency Vitamin D  deficiency is being managed with supplementation every other week. - Continue vitamin D  supplementation every other week.  Hip soreness with activity Intermittent hip soreness with increased activity, possibly due to increased physical activity and weight loss. No significant concern unless symptoms worsen. - Incorporate stretching exercises into routine. - Consider physical therapy if symptoms do not improve.   Prediabetes Last A1c was 5.6- much improved.   Medication(s): Wegovy  1.0 mg SQ weekly and Metformin  500 mg twice daily with meals Polyphagia:{dwwyes:29172} Lab Results  Component Value Date   HGBA1C 5.6 09/13/2023   HGBA1C 6.1 (H) 03/09/2023   HGBA1C 6.0 (H) 06/28/2022   HGBA1C 6.1 (H) 07/06/2021   HGBA1C 6.2 (H) 01/21/2021   Lab Results  Component Value Date   INSULIN  27.1 (H) 09/13/2023   INSULIN  41.2 (H) 03/09/2023   INSULIN  38.5 (H) 06/28/2022   INSULIN  25.8 (H) 07/06/2021   INSULIN  30.7 (H) 01/21/2021    Plan: {dwwmed:29123} {dwwpharmacotherapy:29109}   Hypertension Hypertension {disease control degree:315147}.  Medication(s): lisinopril-hydrochlorothiazide  10-12.5 mg   BP Readings from Last 3 Encounters:  01/04/24 103/66   12/07/23 104/67  11/09/23 122/79   Lab Results  Component Value Date   CREATININE 0.99 09/13/2023   CREATININE 0.93 03/09/2023   CREATININE 1.06 06/28/2022   No results found for: GFR  Plan: Continue all antihypertensives at current dosages. Continue to work on nutrition plan to promote weight loss and improve BP control.   Hyperlipidemia LDL is at goal. Medication(s): Lipitor 20 mg daily. HDL- at goal , Trig- still elevated    Wegovy  started 7/25  Cardiovascular risk factors: dyslipidemia, hypertension, male gender, and obesity (BMI >= 30 kg/m2)  Lab Results  Component Value Date   CHOL 145 09/13/2023   HDL 44 09/13/2023   LDLCALC 72 09/13/2023   TRIG 170 (H) 09/13/2023   Lab Results  Component Value Date   ALT 48 (H) 09/13/2023   AST 25 09/13/2023   ALKPHOS 116 09/13/2023   BILITOT 0.7 09/13/2023   The 10-year ASCVD risk score (Arnett DK, et al., 2019) is: 2.2%   Values used to calculate the score:     Age: 10 years     Clincally relevant sex: Male     Is Non-Hispanic African American: No     Diabetic: No     Tobacco smoker: No     Systolic Blood Pressure: 103 mmHg  Is BP treated: Yes     HDL Cholesterol: 44 mg/dL     Total Cholesterol: 145 mg/dL  Plan: Continue Lipitor, consider addition of Zetia if triglycerides remain elevated.  Continue Wegovy  which should lower CV risks. Continue to work on nutrition plan -decreasing simple carbohydrates, increasing lean proteins, decreasing saturated fats and cholesterol , avoiding trans fats and exercise as able to promote weight loss, improve lipids and decrease cardiovascular risks.   Vitamin D  Deficiency Vitamin D  is at goal of 50.  Most recent vitamin D  level was 50.0- at goal of 50-70. He is on  prescription ergocalciferol  50,000 IU every 14 days. Lab Results  Component Value Date   VD25OH 50.0 09/13/2023   VD25OH 37.8 03/09/2023   VD25OH 34.3 06/28/2022    Plan: {dwwmed:29123}  {dwwslvitdrx:29084}     No data recorded  No data recorded  No data recorded  No data recorded    ASSESSMENT AND PLAN:  Diet: Kevin [name] is currently in the action stage of change. As such, his goal is to continue with weight loss efforts. He has agreed to Category 4 Plan.  Exercise: Kevin [name] has been instructed to work up to a goal of 150 minutes of combined cardio and strengthening exercise per week for weight loss and overall health benefits.   Behavior Modification:  We discussed the following Behavioral Modification Strategies today: increasing lean protein intake, decreasing simple carbohydrates, increasing vegetables, increase H2O intake, increase high fiber foods, no skipping meals, meal planning and cooking strategies, avoiding temptations, and planning for success. We discussed various medication options to help Kevin [name] with his weight loss efforts and we both agreed to ***.  No follow-ups on file.Kevin [name] He was informed of the importance of frequent follow up visits to maximize his success with intensive lifestyle modifications for his multiple health conditions.  Attestation Statements:   Reviewed by clinician on day of visit: allergies, medications, problem list, medical history, surgical history, family history, social history, and previous encounter notes.   Time spent on visit including pre-visit chart review and post-visit care and charting was *** minutes.    Rogerick Baldwin, PA-C

## 2024-01-04 ENCOUNTER — Encounter (INDEPENDENT_AMBULATORY_CARE_PROVIDER_SITE_OTHER): Payer: Self-pay | Admitting: Physician Assistant

## 2024-01-04 ENCOUNTER — Ambulatory Visit (INDEPENDENT_AMBULATORY_CARE_PROVIDER_SITE_OTHER): Admitting: Physician Assistant

## 2024-01-04 VITALS — BP 103/66 | HR 89 | Temp 98.2°F | Ht 73.0 in | Wt 300.0 lb

## 2024-01-04 DIAGNOSIS — R110 Nausea: Secondary | ICD-10-CM | POA: Diagnosis not present

## 2024-01-04 DIAGNOSIS — E669 Obesity, unspecified: Secondary | ICD-10-CM

## 2024-01-04 DIAGNOSIS — E7849 Other hyperlipidemia: Secondary | ICD-10-CM

## 2024-01-04 DIAGNOSIS — R7303 Prediabetes: Secondary | ICD-10-CM

## 2024-01-04 DIAGNOSIS — R632 Polyphagia: Secondary | ICD-10-CM

## 2024-01-04 DIAGNOSIS — I10 Essential (primary) hypertension: Secondary | ICD-10-CM

## 2024-01-04 DIAGNOSIS — E559 Vitamin D deficiency, unspecified: Secondary | ICD-10-CM

## 2024-01-04 DIAGNOSIS — G4733 Obstructive sleep apnea (adult) (pediatric): Secondary | ICD-10-CM

## 2024-01-04 DIAGNOSIS — Z6839 Body mass index (BMI) 39.0-39.9, adult: Secondary | ICD-10-CM

## 2024-01-04 DIAGNOSIS — E538 Deficiency of other specified B group vitamins: Secondary | ICD-10-CM

## 2024-01-04 MED ORDER — ONDANSETRON 4 MG PO TBDP: 4.0000 mg | ORAL_TABLET | Freq: Three times a day (TID) | ORAL | 0 refills | Status: AC | PRN

## 2024-01-04 MED ORDER — WEGOVY 1 MG/0.5ML ~~LOC~~ SOAJ: 1.0000 mg | SUBCUTANEOUS | 0 refills | Status: DC

## 2024-01-05 DIAGNOSIS — G4733 Obstructive sleep apnea (adult) (pediatric): Secondary | ICD-10-CM | POA: Diagnosis not present

## 2024-01-25 DIAGNOSIS — R7401 Elevation of levels of liver transaminase levels: Secondary | ICD-10-CM | POA: Diagnosis not present

## 2024-02-01 ENCOUNTER — Encounter (INDEPENDENT_AMBULATORY_CARE_PROVIDER_SITE_OTHER): Payer: Self-pay | Admitting: Physician Assistant

## 2024-02-01 ENCOUNTER — Ambulatory Visit (INDEPENDENT_AMBULATORY_CARE_PROVIDER_SITE_OTHER): Admitting: Physician Assistant

## 2024-02-01 VITALS — BP 101/65 | HR 74 | Temp 97.7°F | Ht 73.0 in | Wt 303.0 lb

## 2024-02-01 DIAGNOSIS — E559 Vitamin D deficiency, unspecified: Secondary | ICD-10-CM | POA: Diagnosis not present

## 2024-02-01 DIAGNOSIS — E669 Obesity, unspecified: Secondary | ICD-10-CM

## 2024-02-01 DIAGNOSIS — R632 Polyphagia: Secondary | ICD-10-CM

## 2024-02-01 DIAGNOSIS — R110 Nausea: Secondary | ICD-10-CM

## 2024-02-01 DIAGNOSIS — M545 Low back pain, unspecified: Secondary | ICD-10-CM | POA: Diagnosis not present

## 2024-02-01 DIAGNOSIS — I10 Essential (primary) hypertension: Secondary | ICD-10-CM | POA: Diagnosis not present

## 2024-02-01 DIAGNOSIS — R7303 Prediabetes: Secondary | ICD-10-CM

## 2024-02-01 DIAGNOSIS — E7849 Other hyperlipidemia: Secondary | ICD-10-CM

## 2024-02-01 DIAGNOSIS — Z6841 Body Mass Index (BMI) 40.0 and over, adult: Secondary | ICD-10-CM

## 2024-02-01 MED ORDER — METHOCARBAMOL 500 MG PO TABS: 500.0000 mg | ORAL_TABLET | Freq: Three times a day (TID) | ORAL | 0 refills | Status: AC

## 2024-02-01 MED ORDER — WEGOVY 1.7 MG/0.75ML ~~LOC~~ SOAJ: 1.7000 mg | SUBCUTANEOUS | 0 refills | Status: DC

## 2024-02-01 NOTE — Progress Notes (Signed)
 SUBJECTIVE: Discussed the use of AI scribe software for clinical note transcription with the patient, who gave verbal consent to proceed.  Chief Complaint: Obesity  Interim History: He is up 3 lbs since his last visit.  Down 46 lbs overall TBW loss of 13.2 %  Kevin [name] is here to discuss his progress with his obesity treatment plan. He is on the Category 4 Plan and states he is following his eating plan approximately 75 % of the time. He states he is exercising hiking and going to gym 60 minutes 2-3 times per week.  Kevin [name] Kevin [name] is a 53 year old male who presents with acute right-sided low back pain following a muscle strain.  He has acute right-sided low back pain that began after participating in a charity basketball game on Saturday. The pain is described as a tightness in the right side of his back, particularly noticeable when walking or standing after sitting for long periods. No radiation of pain down his leg. He has been managing the pain with ibuprofen  and has tried [facility] and a heating pad, which provided some relief.  He has a history of using Flexeril  for muscle spasms, which causes significant drowsiness even at a low dose. This type of back pain is new for him, and he has not sought medical treatment for it before this visit.  He mentions experiencing a period of depression following the sudden death of his 51 year old dog, which has impacted his routine and dietary habits. He acknowledges a lapse in his diet since the incident but has started to return to his routine as of Monday.  He is currently on Wegovy  for weight management and reports a recent increase in morning hunger despite maintaining a high protein intake at dinner. He has been on a 1 mg dose since July. He has not experienced significant nausea or required Zofran  recently. He continues to take metformin  and vitamin D  as prescribed.  OBJECTIVE: Visit Diagnoses: Problem List Items Addressed This Visit      Prediabetes   Relevant Medications   semaglutide -weight management (WEGOVY ) 1.7 MG/0.75ML SOAJ SQ injection   Vitamin D  deficiency   Essential hypertension   Other hyperlipidemia   Other Visit Diagnoses       Acute right-sided low back pain without sciatica    -  Primary   Relevant Medications   methocarbamol (ROBAXIN) 500 MG tablet     Polyphagia       Relevant Medications   semaglutide -weight management (WEGOVY ) 1.7 MG/0.75ML SOAJ SQ injection     Nausea         Obesity (HCC)- start BMI-46.04       Relevant Medications   semaglutide -weight management (WEGOVY ) 1.7 MG/0.75ML SOAJ SQ injection     BMI 40.0-44.9, adult (HCC) Current BMI 40.0       Relevant Medications   semaglutide -weight management (WEGOVY ) 1.7 MG/0.75ML SOAJ SQ injection     Acute right-sided low back pain Acute low back pain on the right side following a muscle strain during a basketball game. Pain is exacerbated by movement and stiffness is present, but no radicular symptoms are noted. He has been using ibuprofen  and topical treatments with some relief. Robaxin is recommended for muscle spasms as it is well-tolerated and less sedating than Flexeril . - Prescribe Robaxin 500 mg to be taken several times a day as needed for muscle spasms. - Advise use of heat after a few days of injury and consider ice initially for acute injury. -  Recommend follow-up with regular doctor or orthopedic specialist if pain persists.  Obesity with polyphagia Weight gain likely due to recent emotional stress and dietary changes following the loss of a pet. He is attempting to return to healthier habits. Discussion of meal options and dietary strategies to support weight management. [employer] and air fryer use are suggested for healthier eating. Wegovy  dose increase is considered to improve appetite suppression. Polyphagia with fair to moderate control currently - Discuss [employer]als as a convenient and healthy meal option. -  Encourage use of air fryer for healthier cooking methods. - Increase Wegovy  dose to 1.7 mg to improve appetite suppression. - Advise on maintaining high protein intake and hydration. Meds ordered this encounter  Medications   methocarbamol (ROBAXIN) 500 MG tablet    Sig: Take 1 tablet (500 mg total) by mouth 3 (three) times daily.    Dispense:  30 tablet    Refill:  0   semaglutide -weight management (WEGOVY ) 1.7 MG/0.75ML SOAJ SQ injection    Sig: Inject 1.7 mg into the skin once a week.    Dispense:  3 mL    Refill:  0    Prediabetes Prediabetes management ongoing with metformin  and Wegovy . Recent lab results show improvement in A1c.  Lab Results  Component Value Date   HGBA1C 5.6 09/13/2023   HGBA1C 6.1 (H) 03/09/2023   HGBA1C 6.0 (H) 06/28/2022   Lab Results  Component Value Date   LDLCALC 72 09/13/2023   CREATININE 0.99 09/13/2023   Continue working on nutrition plan to decrease simple carbohydrates, increase lean proteins and exercise to promote weight loss, improve glycemic control and prevent progression to Type 2 diabetes.  - Continue metformin  and increase wegovy   as prescribed.  Vitamin D  deficiency Vitamin D  supplementation ongoing with every other week dosing Ergocalciferol  50,000 units every 14 days. No N/V or muscle weakness. Current supply is sufficient until December. Last vitamin D  Lab Results  Component Value Date   VD25OH 50.0 09/13/2023   Low vitamin D  levels can be associated with adiposity and may result in leptin resistance and weight gain. Also associated with fatigue.  Currently on vitamin D  supplementation without any adverse effects such as nausea, vomiting or muscle weakness.  - Continue current vitamin D  supplementation schedule.- Ergocalciferol  50,000 units every other week  Vitals Temp: 97.7 F (36.5 C) BP: 101/65 Pulse Rate: 74 SpO2: 95 %   Anthropometric Measurements Height: 6' 1 (1.854 m) Weight: (!) 303 lb (137.4 kg) BMI  (Calculated): 39.98 Weight at Last Visit: 300 lb Weight Lost Since Last Visit: 0 Weight Gained Since Last Visit: 3 lb Starting Weight: 349 lb Total Weight Loss (lbs): 46 lb (20.9 kg)   Body Composition  Body Fat %: 36.3 % Fat Mass (lbs): 110.2 lbs Muscle Mass (lbs): 183.8 lbs Total Body Water (lbs): 137.4 lbs Visceral Fat Rating : 22   Other Clinical Data Fasting: No Labs: No Today's Visit #: 37 Starting Date: 01/21/21     ASSESSMENT AND PLAN:  Diet: Hanna is currently in the action stage of change. As such, his goal is to continue with weight loss efforts. He has agreed to Category 4 Plan.  Exercise: Guiseppe has been instructed to work up to a goal of 150 minutes of combined cardio and strengthening exercise per week for weight loss and overall health benefits.   Behavior Modification:  We discussed the following Behavioral Modification Strategies today: increasing lean protein intake, decreasing simple carbohydrates, increasing vegetables, increase  H2O intake, increase high fiber foods, meal planning and cooking strategies, avoiding temptations, and planning for success. We discussed various medication options to help Alonza with his weight loss efforts and we both agreed to increase Wegovy  to 1.7 mg weekly and continue other treatment plans.  Return in about 4 weeks (around 02/29/2024).SABRA He was informed of the importance of frequent follow up visits to maximize his success with intensive lifestyle modifications for his multiple health conditions.  Attestation Statements:   Reviewed by clinician on day of visit: allergies, medications, problem list, medical history, surgical history, family history, social history, and previous encounter notes.   Time spent on visit including pre-visit chart review and post-visit care and charting was 24 minutes.    Sathvik Tiedt, PA-C

## 2024-02-02 ENCOUNTER — Telehealth (INDEPENDENT_AMBULATORY_CARE_PROVIDER_SITE_OTHER): Payer: Self-pay | Admitting: *Deleted

## 2024-02-02 NOTE — Telephone Encounter (Signed)
 [place] SUBMITTED VIA [employer]   Kevin [name] (Key: [id number])  This request has received an approval. View the bottom of the request for an electronic copy of the approval letter.   As long as you remain covered by your prescription drug plan and there are no changes to your plan benefits, this request is approved from 02/02/2024 to 08/30/2024.   PATIENT NOTIFIED VIA MYCHART MESSAGE.

## 2024-02-14 DIAGNOSIS — D485 Neoplasm of uncertain behavior of skin: Secondary | ICD-10-CM | POA: Diagnosis not present

## 2024-02-14 DIAGNOSIS — L814 Other melanin hyperpigmentation: Secondary | ICD-10-CM | POA: Diagnosis not present

## 2024-02-14 DIAGNOSIS — D1801 Hemangioma of skin and subcutaneous tissue: Secondary | ICD-10-CM | POA: Diagnosis not present

## 2024-02-14 DIAGNOSIS — L821 Other seborrheic keratosis: Secondary | ICD-10-CM | POA: Diagnosis not present

## 2024-02-27 ENCOUNTER — Encounter (INDEPENDENT_AMBULATORY_CARE_PROVIDER_SITE_OTHER): Payer: Self-pay | Admitting: Physician Assistant

## 2024-02-27 ENCOUNTER — Ambulatory Visit (INDEPENDENT_AMBULATORY_CARE_PROVIDER_SITE_OTHER): Admitting: Physician Assistant

## 2024-02-27 VITALS — BP 105/71 | HR 75 | Temp 97.9°F | Ht 73.0 in | Wt 298.0 lb

## 2024-02-27 DIAGNOSIS — G4733 Obstructive sleep apnea (adult) (pediatric): Secondary | ICD-10-CM

## 2024-02-27 DIAGNOSIS — R7303 Prediabetes: Secondary | ICD-10-CM | POA: Diagnosis not present

## 2024-02-27 DIAGNOSIS — R632 Polyphagia: Secondary | ICD-10-CM

## 2024-02-27 DIAGNOSIS — E559 Vitamin D deficiency, unspecified: Secondary | ICD-10-CM

## 2024-02-27 DIAGNOSIS — E669 Obesity, unspecified: Secondary | ICD-10-CM

## 2024-02-27 DIAGNOSIS — Z6839 Body mass index (BMI) 39.0-39.9, adult: Secondary | ICD-10-CM

## 2024-02-27 MED ORDER — WEGOVY 1.7 MG/0.75ML ~~LOC~~ SOAJ: 1.7000 mg | SUBCUTANEOUS | 0 refills | Status: DC

## 2024-02-27 NOTE — Progress Notes (Signed)
 SUBJECTIVE: Discussed the use of AI scribe software for clinical note transcription with the patient, who gave verbal consent to proceed.  Chief Complaint: Obesity  Interim History: He is down 3 lbs since his last visit.  Down 51 lbs overall TBW loss of 14.6%  Kevin [name] is here to discuss his progress with his obesity treatment plan. He is on the Category 4 Plan and states he is following his eating plan approximately 70 % of the time. He states he is exercising intermittently minutes 1-2 times per week.  Kevin [name] is a 53 year old male with obesity who presents for follow-up of his obesity treatment plan.  He is adhering to his category four obesity treatment plan approximately seventy percent of the time. His diet includes more whole foods, adequate protein, and sufficient water intake. He is not skipping meals and maintains a sleep schedule of seven to nine hours per night with the use of CPAP.  His exercise routine is inconsistent, but he recently completed an eleven-mile hike.  He has lost fifty-one pounds, currently weighing two hundred and ninety-eight pounds with a BMI of thirty-nine point three two. His body adipose percentage is thirty-five point four percent, and he has maintained muscle mass, losing a little over four pounds of adipose mass since his last visit. His visceral adipose rating is twenty-one.  He has a history of polyphagia, prediabetes, hypertension, hyperlipidemia, and vitamin D  deficiency. He was initially prescribed Zepbound  for obstructive sleep apnea but switched to Wegovy  due to insurance changes.  He is currently on 1.7 mg of Wegovy  weekly, metformin  once daily, vitamin D  every fourteen days, vitamin B12 gummies, and Lipitor for cholesterol. No nausea or vomiting with Wegovy , and occasional constipation resolves without intervention. He drinks coffee in the morning and consumes at least sixty-four ounces of water daily.  He experienced a back injury  during a charity basketball game, affecting his exercise routine. He reports soreness in his legs and feet after the recent hike but no significant back pain. He stretches before and after activities to prevent tightness. He has not engaged in strength training recently due to the back injury but is considering resuming it. OBJECTIVE: Visit Diagnoses: Problem List Items Addressed This Visit     Prediabetes   Relevant Medications   semaglutide -weight management (WEGOVY ) 1.7 MG/0.75ML SOAJ SQ injection   Vitamin D  deficiency   OSA (obstructive sleep apnea)   Other Visit Diagnoses       Polyphagia    -  Primary   Relevant Medications   semaglutide -weight management (WEGOVY ) 1.7 MG/0.75ML SOAJ SQ injection     Obesity (HCC)- start BMI-46.04       Relevant Medications   semaglutide -weight management (WEGOVY ) 1.7 MG/0.75ML SOAJ SQ injection     BMI 39.0-39.9,adult Current BMI 39.3          Obesity with polyphagia Obesity management is ongoing with significant weight loss of 51 pounds, current weight at 298 pounds, and BMI of 39.32. Body adipose percentage is 35.4%, with a goal to reduce to 25%. Visceral adipose rating improved to 21. Current regimen includes Wegovy  1.7 mg weekly, dietary modifications, and increased physical activity. No significant side effects from Wegovy  reported. Polyphagia with good control currently.  - Continue/refill Wegovy  1.7 mg weekly. - Encourage dietary adherence with focus on protein intake. - Promote strength training to enhance metabolism and body composition. - Consider [facility] program for supervised training program to safely work back into weight lifting and  cardio training. Meds ordered this encounter  Medications   semaglutide -weight management (WEGOVY ) 1.7 MG/0.75ML SOAJ SQ injection    Sig: Inject 1.7 mg into the skin once a week.    Dispense:  3 mL    Refill:  0    Obstructive sleep apnea Obstructive sleep apnea is managed with CPAP  therapy, which he uses consistently, sleeping 7-9 hours per night. - Continue CPAP therapy. Intensive lifestyle modifications are the first line treatment for this issue. We discussed several lifestyle modifications today and he will continue to work on diet, exercise and weight loss efforts. We will continue to monitor. Previously on Zepbound  . Now on Wegovy . Continue Wegovy  .    Prediabetes Prediabetes is managed with lifestyle modifications and metformin  once daily. Blood pressure and other metabolic parameters are stable. A1c improved and at goal. Insulin  level remains elevated and not at goal.  Lab Results  Component Value Date   HGBA1C 5.6 09/13/2023   HGBA1C 6.1 (H) 03/09/2023   HGBA1C 6.0 (H) 06/28/2022   Lab Results  Component Value Date   LDLCALC 72 09/13/2023   CREATININE 0.99 09/13/2023   INSULIN   Date Value Ref Range Status  09/13/2023 27.1 (H) 2.6 - 24.9 uIU/mL Final  ]Continue working on nutrition plan to decrease simple carbohydrates, increase lean proteins and exercise to promote weight loss, improve glycemic control and prevent progression to Type 2 diabetes.  Continue Wegovy  1.7 mg weekly- refilled - Continue metformin  500 mg once daily.- no refills needed.    Vitamin D  deficiency Vitamin D  deficiency is managed with Ergocalciferol  50,000 units every 14 days. No issues reported with current regimen. No N/V or muscle weakness with Ergocalciferol  Last vitamin D  Lab Results  Component Value Date   VD25OH 50.0 09/13/2023   Low vitamin D  levels can be associated with adiposity and may result in leptin resistance and weight gain. Also associated with fatigue.  Currently on vitamin D  supplementation without any adverse effects such as nausea, vomiting or muscle weakness.  - Continue Ergocalciferol  50,000 units  every 14 days.  Vitals Temp: 97.9 F (36.6 C) BP: 105/71 Pulse Rate: 75 SpO2: 99 %   Anthropometric Measurements Height: 6' 1 (1.854 m) Weight: 298  lb (135.2 kg) BMI (Calculated): 39.32 Weight at Last Visit: 303 lb Weight Lost Since Last Visit: 5 Weight Gained Since Last Visit: 0 Starting Weight: 349 lb Total Weight Loss (lbs): 51 lb (23.1 kg)   Body Composition  Body Fat %: 35.4 % Fat Mass (lbs): 105.8 lbs Muscle Mass (lbs): 183.4 lbs Total Body Water (lbs): 135.2 lbs Visceral Fat Rating : 21   Other Clinical Data Today's Visit #: 10 Starting Date: 01/21/21     ASSESSMENT AND PLAN:  Diet: Karam is currently in the action stage of change. As such, his goal is to continue with weight loss efforts. He has agreed to Category 4 Plan.  Exercise: Kobe has been instructed to work up to a goal of 150 minutes of combined cardio and strengthening exercise per week for weight loss and overall health benefits. Consider [facility] start program to safely resume regular weight training and cardio training   Behavior Modification:  We discussed the following Behavioral Modification Strategies today: increasing lean protein intake, decreasing simple carbohydrates, increasing vegetables, increase H2O intake, increase high fiber foods, meal planning and cooking strategies, avoiding temptations, and planning for success. We discussed various medication options to help Cicero with his weight loss efforts and we both agreed to continue current  treatment plan.  Return in about 4 weeks (around 03/26/2024).SABRA He was informed of the importance of frequent follow up visits to maximize his success with intensive lifestyle modifications for his multiple health conditions.  Attestation Statements:   Reviewed by clinician on day of visit: allergies, medications, problem list, medical history, surgical history, family history, social history, and previous encounter notes.   Time spent on visit including pre-visit chart review and post-visit care and charting was 37 minutes.    Raissa Dam, PA-C

## 2024-03-26 ENCOUNTER — Ambulatory Visit (INDEPENDENT_AMBULATORY_CARE_PROVIDER_SITE_OTHER): Payer: Self-pay | Admitting: Physician Assistant

## 2024-03-26 ENCOUNTER — Encounter (INDEPENDENT_AMBULATORY_CARE_PROVIDER_SITE_OTHER): Payer: Self-pay | Admitting: Physician Assistant

## 2024-03-26 VITALS — BP 98/62 | HR 75 | Temp 98.2°F | Ht 73.0 in | Wt 295.0 lb

## 2024-03-26 DIAGNOSIS — Z6838 Body mass index (BMI) 38.0-38.9, adult: Secondary | ICD-10-CM

## 2024-03-26 DIAGNOSIS — I10 Essential (primary) hypertension: Secondary | ICD-10-CM

## 2024-03-26 DIAGNOSIS — E7849 Other hyperlipidemia: Secondary | ICD-10-CM

## 2024-03-26 DIAGNOSIS — E785 Hyperlipidemia, unspecified: Secondary | ICD-10-CM

## 2024-03-26 DIAGNOSIS — R7303 Prediabetes: Secondary | ICD-10-CM | POA: Diagnosis not present

## 2024-03-26 DIAGNOSIS — R632 Polyphagia: Secondary | ICD-10-CM

## 2024-03-26 DIAGNOSIS — E559 Vitamin D deficiency, unspecified: Secondary | ICD-10-CM

## 2024-03-26 DIAGNOSIS — Z6839 Body mass index (BMI) 39.0-39.9, adult: Secondary | ICD-10-CM

## 2024-03-26 MED ORDER — WEGOVY 1.7 MG/0.75ML ~~LOC~~ SOAJ: 1.7000 mg | SUBCUTANEOUS | 0 refills | Status: DC

## 2024-03-26 NOTE — Progress Notes (Signed)
 SUBJECTIVE: Discussed the use of AI scribe software for clinical note transcription with the patient, who gave verbal consent to proceed.  Chief Complaint: Obesity  Interim History: He is down 3 lbs since his last visit.  Down 54 lbs overall TBW loss of 15.5%  Kevin [name] is here to discuss his progress with his obesity treatment plan. He is on the Category 4 Plan and states he is following his eating plan approximately 70 % of the time. He states he is exercising walking increasing daily.   Kevin [name] is a 53 year old male who presents for follow-up of his obesity treatment plan.  He has been on Wegovy , resulting in a weight loss of 54 pounds, bringing his weight below 300 pounds for the first time in a long time. His body adipose percentage is currently at 35%. His visceral adipose has decreased to 21. He experiences variable hunger and occasional changes in bowel habits but no significant issues with Wegovy .  He has a history of prediabetes with marked improvement in his A1c levels. He continues to take metformin  once daily. He also takes Lipitor and lisinopril daily for cholesterol and blood pressure management, respectively. His blood pressure readings are typically normal at home and during visits to his regular doctor, though they appear lower during today's visit.  He has obstructive sleep apnea, which is part of his medical history. He continues to manage this condition alongside his weight loss efforts.  He has recently taken a second job with flexible hours to manage financial obligations. This has affected his meal timing slightly, but he ensures he eats before starting his shift. He reports increased physical activity due to more walking at his second job, which is not physically demanding.  His current medications include Wegovy  1.7 mg once a week, metformin  once daily, Lipitor, lisinopril, and vitamin D  every other week. He also takes vitamin B supplements. He  monitors his blood pressure periodically at home and reports no symptoms of dizziness or lightheadedness. OBJECTIVE: Visit Diagnoses: Problem List Items Addressed This Visit     Prediabetes - Primary   Relevant Medications   semaglutide -weight management (WEGOVY ) 1.7 MG/0.75ML SOAJ SQ injection   Vitamin D  deficiency   Essential hypertension   Other hyperlipidemia   Other Visit Diagnoses       Polyphagia       Relevant Medications   semaglutide -weight management (WEGOVY ) 1.7 MG/0.75ML SOAJ SQ injection     Obesity (HCC)- start BMI-46.04       Relevant Medications   semaglutide -weight management (WEGOVY ) 1.7 MG/0.75ML SOAJ SQ injection     BMI 39.0-39.9,adult Current BMI 39.0         Obesity with polyphagia Significant weight loss of 54 pounds achieved with Wegovy . Current body adipose percentage is 35%, with a goal of 25% or less. Visceral adipose percentage has decreased to 21%, with a goal of 10% or less. No issues reported with Wegovy , and hunger is managed with dietary adjustments.  Denies mass in neck, dysphagia, dyspepsia, persistent hoarseness, abdominal pain, or N/V/Constipation or diarrhea. Has annual eye exam. Mood is stable.  Muscle mass has decreased slightly, but overall weight loss is positive. Continued weight loss is encouraged, with a long-term goal of maintaining muscle mass to support sustained weight loss. Polyphagia well controlled on current plan - Continue/refill Wegovy  1.7 mg once a week. - Encouraged continued dietary management and hydration. - Encouraged increased physical activity, including walking and potential future weight training. - Scheduled follow-up  appointment on January 19th, 2025.  Prediabetes Marked improvement in A1c levels. Continues on metformin  500 mg once daily and Wegovy  1.7 mg weekly. No current issues reported with medication. Lab Results  Component Value Date   HGBA1C 5.6 09/13/2023   HGBA1C 6.1 (H) 03/09/2023   HGBA1C 6.0 (H)  06/28/2022   Lab Results  Component Value Date   LDLCALC 72 09/13/2023   CREATININE 0.99 09/13/2023   INSULIN   Date Value Ref Range Status  09/13/2023 27.1 (H) 2.6 - 24.9 uIU/mL Final  ]Continue working on nutrition plan to decrease simple carbohydrates, increase lean proteins and exercise to promote weight loss, improve glycemic control and prevent progression to Type 2 diabetes.  - Continue metformin  500 mg once daily. Continue/refill Wegovy  1.7 mg weekly.    Hypertension Blood pressure is low today, but generally normal at home and during regular/PCP doctor visits. Currently on lisinopril and hydrochlorothiazide . No symptoms of dizziness or lightheadedness reported. Discussed potential for medication adjustment if symptoms develop. Reports taken off medication in the past and BP went up  and developed HA, feeling poorly.  BP Readings from Last 3 Encounters:  03/26/24 98/62  02/27/24 105/71  02/01/24 101/65  - Continue lisinopril -hydrochlorothiazide  10-12.5 mg daily - Monitor blood pressure at home. - Report any symptoms of dizziness or lightheadedness.  Hyperlipidemia Continues on Lipitor  20 mg daily for cholesterol management. No SE Last lipids Lab Results  Component Value Date   CHOL 145 09/13/2023   HDL 44 09/13/2023   LDLCALC 72 09/13/2023   TRIG 170 (H) 09/13/2023  - Continue Lipitor 20 mg daily and Wegovy  1.7 mg weekly for CV risk reduction. Continue to work on nutrition plan -decreasing simple carbohydrates, increasing lean proteins, decreasing saturated fats and cholesterol , avoiding trans fats and exercise as able to promote weight loss, improve lipids and decrease cardiovascular risks.   Vitamin D  Deficiency Vitamin D  is at goal of 50.  Most recent vitamin D  level was 50. He is on  prescription ergocalciferol  50,000 IU every 14 days. No N/V or muscle weakness with Ergocalciferol  Lab Results  Component Value Date   VD25OH 50.0 09/13/2023   VD25OH 37.8  03/09/2023   VD25OH 34.3 06/28/2022    Plan: Continue  prescription ergocalciferol  50,000 IU every 14 days Low vitamin D  levels can be associated with adiposity and may result in leptin resistance and weight gain. Also associated with fatigue.  Currently on vitamin D  supplementation without any adverse effects such as nausea, vomiting or muscle weakness.   Vitals Temp: 98.2 F (36.8 C) BP: 98/62 Pulse Rate: 75 SpO2: 97 %   Anthropometric Measurements Height: 6' 1 (1.854 m) Weight: 295 lb (133.8 kg) BMI (Calculated): 38.93 Weight at Last Visit: 298 lb Weight Lost Since Last Visit: 3 lb Weight Gained Since Last Visit: 0 Starting Weight: 349 lb Total Weight Loss (lbs): 54 lb (24.5 kg)   Body Composition  Body Fat %: 35.4 % Fat Mass (lbs): 104.6 lbs Muscle Mass (lbs): 181.4 lbs Total Body Water (lbs): 135.6 lbs Visceral Fat Rating : 21   Other Clinical Data Fasting: No Labs: No Today's Visit #: 39 Starting Date: 01/21/21     ASSESSMENT AND PLAN:  Diet: Kevin [name] is currently in the action stage of change. As such, his goal is to continue with weight loss efforts. He has agreed to Category 4 Plan.  Exercise: Kevin [name] has been instructed to work up to a goal of 150 minutes of combined cardio and strengthening exercise  per week for weight loss and overall health benefits.   Behavior Modification:  We discussed the following Behavioral Modification Strategies today: increasing lean protein intake, decreasing simple carbohydrates, increasing vegetables, increase H2O intake, increase high fiber foods, no skipping meals, meal planning and cooking strategies, holiday eating strategies, avoiding temptations, and planning for success. We discussed various medication options to help Kevin [name] with his weight loss efforts and we both agreed to continue Wegovy  1.7 mg weekly for medical weight loss.  Return in about 4 weeks (around 04/23/2024).SABRA He was informed of the importance  of frequent follow up visits to maximize his success with intensive lifestyle modifications for his multiple health conditions.  Attestation Statements:   Reviewed by clinician on day of visit: allergies, medications, problem list, medical history, surgical history, family history, social history, and previous encounter notes.   Time spent on visit including pre-visit chart review and post-visit care and charting was 30 minutes.    Alicyn Klann, PA-C

## 2024-04-23 ENCOUNTER — Encounter (INDEPENDENT_AMBULATORY_CARE_PROVIDER_SITE_OTHER): Payer: Self-pay | Admitting: Physician Assistant

## 2024-04-23 ENCOUNTER — Ambulatory Visit (INDEPENDENT_AMBULATORY_CARE_PROVIDER_SITE_OTHER): Payer: Self-pay | Admitting: Physician Assistant

## 2024-04-23 VITALS — BP 107/62 | HR 77 | Temp 98.0°F | Ht 73.0 in | Wt 296.0 lb

## 2024-04-23 DIAGNOSIS — E7849 Other hyperlipidemia: Secondary | ICD-10-CM

## 2024-04-23 DIAGNOSIS — I10 Essential (primary) hypertension: Secondary | ICD-10-CM | POA: Diagnosis not present

## 2024-04-23 DIAGNOSIS — Z6839 Body mass index (BMI) 39.0-39.9, adult: Secondary | ICD-10-CM

## 2024-04-23 DIAGNOSIS — R632 Polyphagia: Secondary | ICD-10-CM

## 2024-04-23 DIAGNOSIS — E559 Vitamin D deficiency, unspecified: Secondary | ICD-10-CM | POA: Diagnosis not present

## 2024-04-23 DIAGNOSIS — G4733 Obstructive sleep apnea (adult) (pediatric): Secondary | ICD-10-CM | POA: Diagnosis not present

## 2024-04-23 DIAGNOSIS — R7303 Prediabetes: Secondary | ICD-10-CM

## 2024-04-23 MED ORDER — WEGOVY 1.7 MG/0.75ML ~~LOC~~ SOAJ: 1.7000 mg | SUBCUTANEOUS | 0 refills | Status: DC

## 2024-04-23 NOTE — Progress Notes (Signed)
 "  SUBJECTIVE: Discussed the use of AI scribe software for clinical note transcription with the patient, who gave verbal consent to proceed.  Chief Complaint: Obesity  Interim History: he is up 1 lbs since his last visit.  Down 53 lbs overall TBW loss of 15.2%  Kevin [name] is here to discuss his progress with his obesity treatment plan. He is on the Category 4 Plan and states he is following his eating plan approximately 60 % of the time. He states he is not exercising . [name] Kevin [name] is a 53 year old male who presents for follow-up of his obesity treatment plan.  Kevin [name] is adhering to his obesity treatment plan approximately 60% of the time, resulting in a weight loss of 53 pounds since January 2025, with a current weight of 296 pounds. He has not been exercising recently but is consuming more whole foods, adequate protein, and water. He is not skipping meals and sleeps at least seven hours nightly. He is currently on Wegovy  1.7 mg once weekly since October, after discontinuing Zepbound  due to insurance issues.  He is taking lisinopril and hydrochlorothiazide  10-12.5 mg daily for blood pressure, ergocalciferol  50,000 units every 14 days for vitamin D  deficiency, vitamin B12 500 mcg daily for low serum B12, and atorvastatin 20 mg daily for hyperlipidemia. He also takes metformin  once daily.  Kevin [name] has attended several holiday parties, which may have contributed to a slight weight gain. He experiences stronger hunger and cravings, possibly due to recent indulgences. His part-time job has led to adjustments in his eating schedule, sometimes eating later or earlier than usual, affecting his hunger levels.  He has a history of sleep apnea and uses a CPAP machine, which helps him sleep well. He has been on CPAP for at least two years and plans to follow up with his doctor in February. He feels rested and not overly tired during the day despite working two jobs.  Kevin [name]'s wife has Lynch syndrome and  recently experienced a respiratory illness that developed into pneumonia, requiring two rounds of steroids and antibiotics. Kevin [name] and his son also had a milder version of the illness. He is cautious about attending gatherings due to respiratory viruses.  Kevin [name] mentions some hip problems, which he attributes to aging. He has not been hiking recently due to cooler weather and a busy schedule.  Plan for fasting IC and labs at Feb. 2026 OV  OBJECTIVE: Visit Diagnoses: Problem List Items Addressed This Visit     Prediabetes - Primary   Relevant Medications   semaglutide -weight management (WEGOVY ) 1.7 MG/0.75ML SOAJ SQ injection   Vitamin D  deficiency   Essential hypertension   Other hyperlipidemia   Other Visit Diagnoses       Polyphagia       Relevant Medications   semaglutide -weight management (WEGOVY ) 1.7 MG/0.75ML SOAJ SQ injection     Obesity (HCC)- start BMI-46.04       Relevant Medications   semaglutide -weight management (WEGOVY ) 1.7 MG/0.75ML SOAJ SQ injection      Obesity with polyphagia Weight increased by one pound since last visit, but overall weight loss of 53 pounds since starting GLP-1/GIP medication. Following a category four plan approximately 60% of the time. Not currently exercising. Increased indulgence in food during holiday parties. Hunger and cravings slightly stronger, possibly due to indulgence. Current dose of Wegovy  may not be providing adequate appetite suppression.  Polyphagia is moderately controlled currently.  - Continue/refill Wegovy  1.7 mg once weekly. - Will reassess Wegovy  dosage  at the beginning of next year. - Scheduled fasting labs and indirect calorimetry in February  2026 to assess metabolic changes due to weight loss. - Encouraged maintaining current dietary habits and monitoring hunger cues. Meds ordered this encounter  Medications   semaglutide -weight management (WEGOVY ) 1.7 MG/0.75ML SOAJ SQ injection    Sig: Inject 1.7 mg into the skin once  a week.    Dispense:  3 mL    Refill:  0    Essential hypertension Currently managed with lisinopril and hydrochlorothiazide . BP Readings from Last 3 Encounters:  04/23/24 107/62  03/26/24 98/62  02/27/24 105/71   Lab Results  Component Value Date   NA 138 09/13/2023   CL 100 09/13/2023   K 4.1 09/13/2023   CO2 21 09/13/2023   BUN 13 09/13/2023   CREATININE 0.99 09/13/2023   EGFR 92 09/13/2023   CALCIUM 9.6 09/13/2023   ALBUMIN 4.7 09/13/2023   GLUCOSE 100 (H) 09/13/2023   Labs done with PCP  - Continue lisinopril and hydrochlorothiazide  as prescribed. Continue to work on nutrition plan to promote weight loss and improve BP control.     Vitamin D  deficiency Managed with ergocalciferol .50,000 units every other week.  No N/V or muscle weakness with Ergocalciferol  Last vitamin D  Lab Results  Component Value Date   VD25OH 50.0 09/13/2023  Low vitamin D  levels can be associated with adiposity and may result in leptin resistance and weight gain. Also associated with fatigue.  Currently on vitamin D  supplementation without any adverse effects such as nausea, vomiting or muscle weakness.  Lab at goal.  - Continue ergocalciferol  50,000 units every 14 days. No refill needed this visit.   Obstructive sleep apnea Managed with CPAP. No significant change in symptoms with weight loss. Reports some fatigue recently, but working 2 jobs , so may be related to working more lately. Reports CPAP machine shows normal ranges during sleep. - Continue CPAP therapy. - Follow up with CPAP provider in February as scheduled.  Vitamin B12 deficiency Managed with over-the-counter vitamin B12 supplementation. - Continue vitamin B12 supplementation. Vitals Temp: 98 F (36.7 C) BP: 107/62 Pulse Rate: 77 SpO2: 97 %   Anthropometric Measurements Height: 6' 1 (1.854 m) Weight: 296 lb (134.3 kg) BMI (Calculated): 39.06 Weight at Last Visit: 295 lb Weight Lost Since Last Visit: 0 Weight  Gained Since Last Visit: 1 lb Starting Weight: 349 lb Total Weight Loss (lbs): 53 lb (24 kg)   Body Composition  Body Fat %: 358 % Fat Mass (lbs): 106.2 lbs Muscle Mass (lbs): 181.4 lbs Total Body Water (lbs): 136 lbs Visceral Fat Rating : 21   Other Clinical Data Fasting: No Labs: No Today's Visit #: 40 Starting Date: 01/21/21     ASSESSMENT AND PLAN:  Diet: Emily is currently in the action stage of change. As such, his goal is to continue with weight loss efforts. He has agreed to Category 4 Plan.  Exercise: Kevin [name] has been instructed to work up to a goal of 150 minutes of combined cardio and strengthening exercise per week for weight loss and overall health benefits.   Behavior Modification:  We discussed the following Behavioral Modification Strategies today: increasing lean protein intake, decreasing simple carbohydrates, increasing vegetables, increase H2O intake, increase high fiber foods, no skipping meals, meal planning and cooking strategies, holiday eating strategies, avoiding temptations, and planning for success. We discussed various medication options to help Kevin [name] with his weight loss efforts and we both agreed to continue Wegovy  1.7 mg weekly.  Return in about 4 weeks (around 05/21/2024).SABRA He was informed of the importance of frequent follow up visits to maximize his success with intensive lifestyle modifications for his multiple health conditions.  Attestation Statements:   Reviewed by clinician on day of visit: allergies, medications, problem list, medical history, surgical history, family history, social history, and previous encounter notes.   Time spent on visit including pre-visit chart review and post-visit care and charting was 25 minutes.    Elyanah Farino, PA-C  "

## 2024-05-21 ENCOUNTER — Ambulatory Visit (INDEPENDENT_AMBULATORY_CARE_PROVIDER_SITE_OTHER): Admitting: Physician Assistant

## 2024-05-21 ENCOUNTER — Encounter (INDEPENDENT_AMBULATORY_CARE_PROVIDER_SITE_OTHER): Payer: Self-pay | Admitting: Physician Assistant

## 2024-05-21 VITALS — BP 109/66 | HR 74 | Temp 98.2°F | Ht 73.0 in | Wt 291.0 lb

## 2024-05-21 DIAGNOSIS — Z6838 Body mass index (BMI) 38.0-38.9, adult: Secondary | ICD-10-CM | POA: Diagnosis not present

## 2024-05-21 DIAGNOSIS — R632 Polyphagia: Secondary | ICD-10-CM

## 2024-05-21 DIAGNOSIS — R7303 Prediabetes: Secondary | ICD-10-CM | POA: Diagnosis not present

## 2024-05-21 DIAGNOSIS — E7849 Other hyperlipidemia: Secondary | ICD-10-CM

## 2024-05-21 DIAGNOSIS — E785 Hyperlipidemia, unspecified: Secondary | ICD-10-CM

## 2024-05-21 DIAGNOSIS — I10 Essential (primary) hypertension: Secondary | ICD-10-CM

## 2024-05-21 DIAGNOSIS — E669 Obesity, unspecified: Secondary | ICD-10-CM | POA: Diagnosis not present

## 2024-05-21 DIAGNOSIS — G4733 Obstructive sleep apnea (adult) (pediatric): Secondary | ICD-10-CM | POA: Diagnosis not present

## 2024-05-21 DIAGNOSIS — E559 Vitamin D deficiency, unspecified: Secondary | ICD-10-CM | POA: Diagnosis not present

## 2024-05-21 MED ORDER — SEMAGLUTIDE-WEIGHT MANAGEMENT 1.7 MG/0.75ML ~~LOC~~ SOAJ: 1.7000 mg | SUBCUTANEOUS | 1 refills | Status: AC

## 2024-05-21 NOTE — Progress Notes (Signed)
 "  SUBJECTIVE: Discussed the use of AI scribe software for clinical note transcription with the patient, who gave verbal consent to proceed.  Chief Complaint: Obesity  Interim History: He is down 5 lbs since his last visit.  Down 58 lbs overall TBW loss of 16.6%  Judge is here to discuss his progress with his obesity treatment plan. He is on the Category 4 Plan and states he is following his eating plan approximately 70 % of the time. He states he is exercising at gym 60 minutes 2 times per week.  Kevin [name] is a 54 year old male with obesity who presents for follow-up of his obesity treatment plan.  He has been on semaglutide -Wegovy  1.7 mg weekly for the past month and has lost an additional five pounds, totaling a weight loss of fifty-eight pounds since starting the treatment. He follows a category four diet plan approximately seventy percent of the time and exercises regularly, attending the gym for sixty minutes twice weekly. His current weight is 291 pounds, down from an initial weight of 349 pounds. He has also decreased his pant size from 46 to 42, indicating a reduction in waist circumference.  He is currently taking metformin  500 mg twice daily for prediabetes, ergocalciferol  50,000 units once every fourteen days, vitamin B12 500 mcg daily, atorvastatin 20 mg daily for hyperlipidemia, and lisinopril-hydrochlorothiazide  10/12.5 mg daily for hypertension.  No significant side effects from Wegovy , although he experiences constipation, particularly towards the end of the week before his next dose. He manages this by increasing his water intake and consuming fibrous fruits and vegetables.  He has adjusted his eating habits to avoid overeating, especially in the evenings after work. He uses protein bars as a meal supplement during busy workdays to prevent late-night eating. He mentions that these bars, although not very palatable, help him maintain his dietary goals.  He works  part-time and has adjusted his schedule to accommodate regular gym visits.  Plan for fasting IC and labs at Feb. 2026 OV - Arrive at 7:30 am OBJECTIVE: Visit Diagnoses: Problem List Items Addressed This Visit     Prediabetes   Relevant Medications   semaglutide -weight management (WEGOVY ) 1.7 MG/0.75ML SOAJ SQ injection   Vitamin D  deficiency   Essential hypertension   Other hyperlipidemia - Primary   Relevant Medications   semaglutide -weight management (WEGOVY ) 1.7 MG/0.75ML SOAJ SQ injection   OSA (obstructive sleep apnea)   Relevant Medications   semaglutide -weight management (WEGOVY ) 1.7 MG/0.75ML SOAJ SQ injection   Other Visit Diagnoses       Polyphagia       Relevant Medications   semaglutide -weight management (WEGOVY ) 1.7 MG/0.75ML SOAJ SQ injection     Obesity (HCC)- start BMI-46.04       Relevant Medications   semaglutide -weight management (WEGOVY ) 1.7 MG/0.75ML SOAJ SQ injection     BMI 38.0-38.9,adult Current BMI 38.4          Obesity with polyphagia Significant weight loss of 58 pounds (16.6% of body weight) achieved with Wegovy  1.7 mg weekly, dietary modifications, and regular exercise. Current weight is 291 pounds. No significant side effects from Wegovy , except occasional constipation managed with increased water intake. Insurance coverage for Wegovy  is uncertain due to changes in categorization. Polyphagia well controlled on current regimen - Continue Wegovy  1.7 mg weekly. - Encouraged regular exercise, focusing on strength training. - Advised on dietary modifications, including increased water intake and fiber-rich foods to manage constipation. - Will schedule fasting metabolism test at  next visit to assess caloric needs. Meds ordered this encounter  Medications   semaglutide -weight management (WEGOVY ) 1.7 MG/0.75ML SOAJ SQ injection    Sig: Inject 1.7 mg into the skin once a week.    Dispense:  3 mL    Refill:  1   Hyperlipidemia LDL is at  goal. Medication(s): atorvastatin 20 mg daily. No SE   Semaglutide -Wegovy  1.7 mg weekly. Occasional constipation, otherwise no SE.   Denies mass in neck, dysphagia, dyspepsia, persistent hoarseness, abdominal pain, or N/V or diarrhea. Has annual eye exam. Mood is stable.      Cardiovascular risk factors: dyslipidemia, hypertension, male gender, and obesity (BMI >= 30 kg/m2)  Lab Results  Component Value Date   CHOL 145 09/13/2023   HDL 44 09/13/2023   LDLCALC 72 09/13/2023   TRIG 170 (H) 09/13/2023   Lab Results  Component Value Date   ALT 48 (H) 09/13/2023   AST 25 09/13/2023   ALKPHOS 116 09/13/2023   BILITOT 0.7 09/13/2023   The 10-year ASCVD risk score (Arnett DK, et al., 2019) is: 2.5%   Values used to calculate the score:     Age: 41 years     Clinically relevant sex: Male     Is Non-Hispanic African American: No     Diabetic: No     Tobacco smoker: No     Systolic Blood Pressure: 109 mmHg     Is BP treated: Yes     HDL Cholesterol: 44 mg/dL     Total Cholesterol: 145 mg/dL  Plan: Continue statin. Continue semaglutide - Wegovy  Continue to work on nutrition plan -decreasing simple carbohydrates, increasing lean proteins, decreasing saturated fats and cholesterol , avoiding trans fats and exercise as able to promote weight loss, improve lipids and decrease cardiovascular risks.   Prediabetes Managed with metformin  500 mg twice daily. Semaglutide  should also help with glycemic control. Weight loss and dietary changes are expected to improve glycemic control. Lab Results  Component Value Date   HGBA1C 5.6 09/13/2023   HGBA1C 6.1 (H) 03/09/2023   HGBA1C 6.0 (H) 06/28/2022   Lab Results  Component Value Date   LDLCALC 72 09/13/2023   CREATININE 0.99 09/13/2023   INSULIN   Date Value Ref Range Status  09/13/2023 27.1 (H) 2.6 - 24.9 uIU/mL Final  ]Continue working on nutrition plan to decrease simple carbohydrates, increase lean proteins and exercise to promote  weight loss, improve glycemic control and prevent progression to Type 2 diabetes.  Recheck labs next visit.  - Continue metformin  500 mg twice daily. No refill needed Continue/refill semaglutide  1.7 mg weekly  Obstructive sleep apnea Managed with weight loss efforts. Previously on Zepbound  with excellent results, but changed to Wegovy  due to insurance coverage issues. Using CPAP with good compliance.  Plan: Intensive lifestyle modifications are the first line treatment for this issue. We discussed several lifestyle modifications today and he will continue to work on diet, exercise and weight loss efforts. We will continue to monitor. Orders and follow up as documented in patient record. ? Insurance covering Zepbound  again for OSA- pt will check with insurance for possible coverage.    Essential hypertension Managed with lisinopril-hydrochlorothiazide  10/12.5 mg daily. No SE. BP Readings from Last 3 Encounters:  05/21/24 109/66  04/23/24 107/62  03/26/24 98/62   Continue to work on nutrition plan to promote weight loss and improve BP control.  May want to monitor off medication in future with further weight loss.  - Continue lisinopril-hydrochlorothiazide  10/12.5 mg daily.  Vitamin D  deficiency Managed with ergocalciferol  50,000 units every 14 days.Reports no N/V or muscle weakness with Ergocalciferol  . Last vitamin D  Lab Results  Component Value Date   VD25OH 50.0 09/13/2023   Low vitamin D  levels can be associated with adiposity and may result in leptin resistance and weight gain. Also associated with fatigue.  Currently on vitamin D  supplementation without any adverse effects such as nausea, vomiting or muscle weakness.  - Continue ergocalciferol  50,000 units every 14 days.  No refill needed. Check labs next OV   Vitals Temp: 98.2 F (36.8 C) BP: 109/66 Pulse Rate: 74 SpO2: 96 %   Anthropometric Measurements Height: 6' 1 (1.854 m) Weight: 291 lb (132 kg) BMI  (Calculated): 38.4 Weight at Last Visit: 296 lb Weight Lost Since Last Visit: 5 lb Weight Gained Since Last Visit: 0 Starting Weight: 349 lb Total Weight Loss (lbs): 58 lb (26.3 kg)   Body Composition  Body Fat %: 34.8 % Fat Mass (lbs): 101.4 lbs Muscle Mass (lbs): 180.8 lbs Total Body Water (lbs): 130.4 lbs Visceral Fat Rating : 20   Other Clinical Data Fasting: No Labs: No Today's Visit #: 41 Starting Date: 01/21/21     ASSESSMENT AND PLAN:  Diet: Arjun is currently in the action stage of change. As such, his goal is to continue with weight loss efforts. He has agreed to Category 4 Plan.  Exercise: Gurnoor has been instructed to work up to a goal of 150 minutes of combined cardio and strengthening exercise per week for weight loss and overall health benefits.   Behavior Modification:  We discussed the following Behavioral Modification Strategies today: increasing lean protein intake, decreasing simple carbohydrates, increasing vegetables, increase H2O intake, increase high fiber foods, no skipping meals, meal planning and cooking strategies, avoiding temptations, and planning for success. We discussed various medication options to help Treylon with his weight loss efforts and we both agreed to continue semaglutide  1.7 mg weekly for hyperlipidemia, prediabetes, polyphagia and medical weight loss.  Return in about 4 weeks (around 06/18/2024) for Fasting Lab, Fasting IC.SABRA He was informed of the importance of frequent follow up visits to maximize his success with intensive lifestyle modifications for his multiple health conditions.  Attestation Statements:   Reviewed by clinician on day of visit: allergies, medications, problem list, medical history, surgical history, family history, social history, and previous encounter notes.   Time spent on visit including pre-visit chart review and post-visit care and charting was 41 minutes.    Taite Baldassari, PA-C  "

## 2024-06-18 ENCOUNTER — Ambulatory Visit (INDEPENDENT_AMBULATORY_CARE_PROVIDER_SITE_OTHER): Admitting: Physician Assistant

## 2024-06-26 ENCOUNTER — Telehealth: Payer: BC Managed Care – PPO | Admitting: Family Medicine

## 2024-07-16 ENCOUNTER — Ambulatory Visit (INDEPENDENT_AMBULATORY_CARE_PROVIDER_SITE_OTHER): Admitting: Physician Assistant
